# Patient Record
Sex: Male | Born: 1945 | Race: White | Hispanic: No | Marital: Married | Smoking: Never smoker
Health system: Southern US, Community
[De-identification: ages and names within clinical notes are randomized; demographics above are authoritative.]

## PROBLEM LIST (undated history)

## (undated) DIAGNOSIS — M1990 Unspecified osteoarthritis, unspecified site: Secondary | ICD-10-CM

## (undated) DIAGNOSIS — I10 Essential (primary) hypertension: Secondary | ICD-10-CM

## (undated) HISTORY — PX: JOINT REPLACEMENT: SHX530

## (undated) HISTORY — PX: EYE SURGERY: SHX253

## (undated) HISTORY — PX: PROSTATE SURGERY: SHX751

---

## 2009-10-04 ENCOUNTER — Inpatient Hospital Stay (HOSPITAL_COMMUNITY): Admission: RE | Admit: 2009-10-04 | Discharge: 2009-10-07 | Payer: Self-pay | Admitting: Orthopedic Surgery

## 2010-05-05 LAB — URINALYSIS, ROUTINE W REFLEX MICROSCOPIC
Hgb urine dipstick: NEGATIVE
Protein, ur: NEGATIVE mg/dL
Urine Glucose, Fasting: NEGATIVE mg/dL
pH: 6 (ref 5.0–8.0)

## 2010-05-05 LAB — CBC
MCHC: 34.3 g/dL (ref 30.0–36.0)
MCV: 95.3 fL (ref 78.0–100.0)
RDW: 13 % (ref 11.5–15.5)
WBC: 5.9 10*3/uL (ref 4.0–10.5)

## 2010-05-05 LAB — COMPREHENSIVE METABOLIC PANEL
ALT: 22 U/L (ref 0–53)
AST: 26 U/L (ref 0–37)
Alkaline Phosphatase: 55 U/L (ref 39–117)
BUN: 16 mg/dL (ref 6–23)
CO2: 30 mEq/L (ref 19–32)
Calcium: 9.3 mg/dL (ref 8.4–10.5)
Chloride: 105 mEq/L (ref 96–112)
GFR calc non Af Amer: >60 mL/min (ref >60)
Glucose, Bld: 81 mg/dL (ref 70–99)
Sodium: 143 mEq/L (ref 135–145)
Total Bilirubin: 1 mg/dL (ref 0.3–1.2)

## 2010-05-05 LAB — SURGICAL PCR SCREEN: Staphylococcus aureus: NEGATIVE

## 2010-05-05 LAB — PROTIME-INR
INR: 0.93 (ref 0.00–1.49)
Prothrombin Time: 12.7 seconds (ref 11.6–15.2)

## 2010-05-13 ENCOUNTER — Inpatient Hospital Stay (HOSPITAL_COMMUNITY)
Admission: RE | Admit: 2010-05-13 | Discharge: 2010-05-16 | DRG: 209 | Disposition: A | Discharge status: Home / Self Care | Payer: BC Managed Care – PPO | Attending: Orthopedic Surgery | Admitting: Orthopedic Surgery

## 2010-05-13 DIAGNOSIS — M1710 Unilateral primary osteoarthritis, unspecified knee: Principal | ICD-10-CM | POA: Diagnosis present

## 2010-05-13 DIAGNOSIS — N400 Enlarged prostate without lower urinary tract symptoms: Secondary | ICD-10-CM | POA: Diagnosis present

## 2010-05-13 DIAGNOSIS — R609 Edema, unspecified: Secondary | ICD-10-CM | POA: Diagnosis present

## 2010-05-13 DIAGNOSIS — E7800 Pure hypercholesterolemia, unspecified: Secondary | ICD-10-CM | POA: Diagnosis present

## 2010-05-13 DIAGNOSIS — I10 Essential (primary) hypertension: Secondary | ICD-10-CM | POA: Diagnosis present

## 2010-05-13 LAB — TYPE AND SCREEN
ABO/RH(D): A POS
Antibody Screen: NEGATIVE

## 2010-05-14 LAB — BASIC METABOLIC PANEL
BUN: 12 mg/dL (ref 6–23)
GFR calc Af Amer: >60 mL/min (ref >60)
GFR calc non Af Amer: >60 mL/min (ref >60)
Glucose, Bld: 147 mg/dL — ABNORMAL HIGH (ref 70–99)

## 2010-05-14 LAB — CBC
HCT: 35.7 % — ABNORMAL LOW (ref 39.0–52.0)
Hemoglobin: 12 g/dL — ABNORMAL LOW (ref 13.0–17.0)
MCH: 32.1 pg (ref 26.0–34.0)
MCV: 95.5 fL (ref 78.0–100.0)
Platelets: 199 10*3/uL (ref 150–400)
RBC: 3.74 MIL/uL — ABNORMAL LOW (ref 4.22–5.81)
RDW: 12.8 % (ref 11.5–15.5)

## 2010-05-15 LAB — CBC
MCV: 94.9 fL (ref 78.0–100.0)
Platelets: 169 10*3/uL (ref 150–400)
RDW: 12.7 % (ref 11.5–15.5)

## 2010-05-15 LAB — BASIC METABOLIC PANEL
CO2: 31 mEq/L (ref 19–32)
Calcium: 8.6 mg/dL (ref 8.4–10.5)
GFR calc non Af Amer: >60 mL/min (ref >60)

## 2010-05-16 LAB — CBC
MCHC: 34.5 g/dL (ref 30.0–36.0)
Platelets: 176 10*3/uL (ref 150–400)
RDW: 12.7 % (ref 11.5–15.5)

## 2010-05-25 NOTE — Discharge Summary (Signed)
NAME:  Steven Harrington, TINDEL              ACCOUNT NO.:  [id number]  MEDICAL RECORD NO.:  [id number]           PATIENT TYPE:  I  LOCATION:  1604                         FACILITY:  [facility]  PHYSICIAN:  Ollen Gross, M.D.    DATE OF BIRTH:  1945/12/26  DATE OF ADMISSION:  05/13/2010 DATE OF DISCHARGE:  05/16/2010                              DISCHARGE SUMMARY   ADMITTING DIAGNOSES: 1. Osteoarthritis right knee. 2. Hypertension. 3. Mild hypercholesterolemia. 4. Benign prostatic hypertrophy.  DISCHARGE DIAGNOSES: 1. Osteoarthritis right knee status post right total knee replacement     arthroplasty. 2. Hypertension. 3. Mild hypercholesterolemia. 4. Benign prostatic hypertrophy.  PROCEDURE:  On May 13, 2010, right total knee.  SURGEON:  Ollen Gross, M.D.  ASSISTANT:  Patrica Duel, PA-C.  ANESTHESIA:  General.  TOURNIQUET TIME:  43 minutes.  CONSULTS:  None.  BRIEF HISTORY:  Steven Harrington is a 65 year old male seen by Dr. [name] for ongoing progressive end-stage arthritis of the right knee.  He is felt to benefit from undergoing surgical intervention.  Risks and benefits have been discussed and he elected to proceed with surgery.  LABORATORY DATA:  Preop CBC showed hemoglobin of 14.7, hematocrit 42.8, white cell count 5.9, and platelets 201,000.  PT/INR 12.7 and 0.93 with a PTT of 29.  Chem panel on admission all within normal limits.  Preop UA negative.  Blood group type A+.  Nasal swabs were negative for staph aureus and negative for MRSA.  Serial CBCs were followed.  Hemoglobin dropped down to 12 and 10.8 last night.  H and H is 10.1 and 29.3. Serial BMETs were followed for 48 hours.  Electrolytes remained within normal limits.  Glucose did go up from 81 to 147 and back down to 124.  EKG dated September 23, 2009, normal sinus rhythm, nonspecific ST changes confirmed by Dr. Eldridge Dace.  HOSPITAL COURSE:  The patient admitted to [facility], taken to OR,  underwent above-stated procedure without complication.  The patient tolerated the procedure well, later transferred to the recovery room orthopedic floor, started on p.o. and IV analgesic pain control following the surgery.  He had a Hemovac drain placed on the surgery which was pulled without difficulty.  He had a pain pump drain placed which was left in for the first couple of days.  He was allowed to be weightbearing as tolerated.  He was placed on Xarelto for DVT prophylaxis, given 24 hours postop IV antibiotics.  He did actually very well on the morning of day #1.  Hemoglobin looked good.  Starting to get up out of bed with therapy and actually walked extremely well on the first day, and by day #2, he was walking over 400 feet.  He did well with this therapy and was tolerating his p.o. meds.  He was seen on postop day #3 on Monday morning by Dr. [name].  The patient was doing well, progressing with therapy, walking over 400 feet, and tolerating his meds.  He was ready to go home at that point.  DISCHARGE PLAN: 1. The patient was discharged home on May 16, 2010. 2. Discharge diagnoses, please see  above.  DISCHARGE MEDICATIONS: 1. Percocet. 2. Robaxin. 3. Xarelto. 4. Continue home meds of aspirin and ramipril.  FOLLOWUP:  In 2 weeks on Thursday May 26, 2010, call the office for an appointment at [phone].  ACTIVITY:  Weightbearing as tolerated, total knee protocol.  Home health PT, home health nursing.  DISPOSITION:  Home.  CONDITION ON DISCHARGE:  Improved.     Alexzandrew L. Julien Girt, P.A.C.   ______________________________ Ollen Gross, M.D.    ALP/MEDQ  D:  05/16/2010  T:  05/16/2010  Job:  [id number]  cc:   Mattie Marlin, M.D.  Dr. Azucena Kuba  Electronically Signed by Patrica Duel P.A.C. on 05/17/2010 07:44:01 AM Electronically Signed by Ollen Gross M.D. on 05/25/2010 02:52:19 PM

## 2010-05-25 NOTE — Op Note (Signed)
NAME:  Steven Harrington, Steven Harrington NO.:  [id number]  MEDICAL RECORD NO.:  [id number]           PATIENT TYPE:  I  LOCATION:  0003                         FACILITY:  [facility]  PHYSICIAN:  Ollen Gross, M.D.    DATE OF BIRTH:  January 27, 1946  DATE OF PROCEDURE:  05/13/2010 DATE OF DISCHARGE:                              OPERATIVE REPORT   PREOPERATIVE DIAGNOSIS:  Osteoarthritis, right knee.  POSTOPERATIVE DIAGNOSIS:  Osteoarthritis, right knee.  PROCEDURE:  Right total knee arthroplasty.  SURGEON:  Ollen Gross, MD  ASSISTANT:  Alexzandrew L. Perkins, PA-C  ANESTHESIA:  General.  ESTIMATED BLOOD LOSS:  Minimal.  DRAINS:  Hemovac x1.  TOURNIQUET TIME:  43 minutes at 300 mmHg.  COMPLICATIONS:  None.  CONDITION:  Stable to recovery room.  BRIEF CLINICAL NOTE:  Mr. Trejos is a 65 year old male, end-stage arthritis of the right knee with progressively worsening pain and dysfunction.  He has failed nonoperative management and presents for total knee arthroplasty.  PROCEDURE IN DETAIL:  After successful administration of general anesthetic, a tourniquet was placed on the right thigh on right lower extremity, prepped and draped in usual sterile fashion.  Extremities wrapped in Esmarch, knee flexed, tourniquet inflated to 300 mmHg. Midline incision was made with a 10 blade through subcutaneous tissue to the level of the extensor mechanism.  A fresh blade is used to make a medial parapatellar arthrotomy.  Soft tissue on the proximal medial tibia subperiosteally elevated to the joint line with the knife and into the semimembranosus bursa with a Cobb elevator.  Soft tissue laterally is elevated with attention being paid to avoid patellar tendon on tibial tubercle.  Knee was flexed to 90 degrees, ACL and PCL removed.  Drill was used to create a starting hole in the distal femur and the canal was thoroughly irrigated.  The 5 degrees right valgus alignment guide is placed and a  distal femoral cutting block is pinned to remove 11 mm off the distal femur.  Distal femoral resection is made with oscillating saw.  Sizing guide is placed, size 5 is most appropriate.  Rotations marked off the epicondylar axis.  Size 5 cutting block is placed and the anterior-posterior and chamfer cuts made.  The tibia subluxed forward and the menisci removed.  Extramedullary tibial alignment guide is placed referencing proximally at the medial aspect of the tibial tubercle and distally along the second metatarsal axis tibial crest.  The block is pinned to remove 2 mm off the more deficient medial side.  Tibial resection is made with an oscillating saw.  Size 5 is most appropriate tibial component and the proximal tibia is prepared to modular drill and keel punch for the size 5.  Femoral preparation is completed in the intercondylar cut.  Size 5 mobile bearing tibial trial, size 5 posterior stabilized femoral trial, and a 10-mm posterior stabilized rotating platform insert trial are placed.  With the 10, full extensions achieved with excellent varus- valgus and anterior-posterior balance throughout full range of motion. Patella was everted, thickness measured to be 26 mm.  Freehand resection taken to 14 mm, 41 template is placed, lug holes  were drilled, trial patella was placed and it tracks normally.  Osteophytes removed off the posterior femur with the trial in place.  All trials were removed and the cut bone surfaces prepared with pulsatile lavage.  Cement was mixed and once ready for implantation, the size 5 mobile bearing tibial tray, size 5 posterior stabilized femur, and 41 patellar cemented in place. Patella is held the clamp.  Trial 10-mm insert is placed, knee held in full extension.  All extruded cement removed.  When the cement fully hardened, then a permanent 10 mm posterior stabilized rotating platform insert is placed in the tibial tray.  Wounds copiously irrigated  saline solution and the arthrotomy closed over Hemovac drain with interrupted #1 PDS.  Flexion against gravity to 135 degrees.  Patella tracks normally.  Subcu is closed with interrupted 2-0 Vicryl and tourniquet released total time of 43 minutes.  Subcuticular was closed with running 4-0 Monocryl.  The incisions cleaned and dried and then the catheter for Marcaine pain pump placed and the pump initiated.  Steri-Strips and bulky sterile dressing are applied and then he is placed into a knee immobilizer, awakened, and transferred to recovery in stable condition.     Ollen Gross, M.D.     FA/MEDQ  D:  05/13/2010  T:  05/13/2010  Job:  [id number]  Electronically Signed by Ollen Gross M.D. on 05/25/2010 02:52:22 PM

## 2010-05-25 NOTE — H&P (Signed)
NAME:  Steven Harrington, Steven Harrington              ACCOUNT NO.:  [id number]  MEDICAL RECORD NO.:  [id number]           PATIENT TYPE:  I  LOCATION:  1604                         FACILITY:  [facility]  PHYSICIAN:  Ollen Gross, M.D.    DATE OF BIRTH:  1945-07-09  DATE OF ADMISSION:  05/13/2010 DATE OF DISCHARGE:  05/16/2010                             HISTORY & PHYSICAL   CHIEF COMPLAINT:  Right knee pain.  HISTORY OF PRESENT ILLNESS:  The patient is a 65 year old male who has seen by Dr. [name] for ongoing right knee pain.  He has had progressive knee problems for quite some time now.  He has been seen in the office. He has had a previous left total hip and doing very well with his left hip.  The right knee is the issue now.  It is felt he would benefit from undergoing surgical intervention.  Risks and benefits have been discussed.  He would like to proceed with surgery.  ALLERGIES:  No known drug allergies.  CURRENT MEDICATIONS:  Ramipril 10 mg twice a day, glucosamine twice a day, Citracal twice a day, Krill oil twice a day, men's one a day vitamin, aspirin once a day.  PAST MEDICAL HISTORY:  Hypertension, mild hypercholesterolemia, benign prostatic hypertrophy and osteoarthritis.  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  Mother living at age 45 with hypertension.  Diabetes runs in his mother's family.  He has a brother with asthma.  SOCIAL HISTORY:  Married.  Retired Runner, broadcasting/film/video.  Nonsmoker.  Two drinks 3 times a week of alcohol intake.  His wife [name] will be assisting with care after surgery.  He has 2 steps entering his home.  He does have living will.  REVIEW OF SYSTEMS:  GENERAL:  No fevers, chills or night sweats.  NEURO: No seizure, syncope, or paralysis.  RESPIRATORY:  No shortness breath, productive cough or hemoptysis.  CARDIOVASCULAR:  No chest pain, no orthopnea.  GI: No  nausea, vomiting, diarrhea, constipation.  GU: No dysuria, hematuria, discharge.  Musculoskeletal: Right knee.  PHYSICAL EXAMINATION:  VITAL SIGNS:  Pulse 68, respirations 14, blood pressure 142/88. GENERAL:  A 65 year old white male, well nourished, well developed, no acute distress, slightly overweight.  He is alert, oriented, cooperative. HEENT:  Normocephalic, atraumatic.  Pupils round and reactive.  EOMs intact.  He wears  glasses. NECK:  Supple. CHEST:  Clear. HEART:  Regular rate and rhythm without murmur. ABDOMEN:  Soft, nontender.  Bowel sounds present. RECTAL:  Not done, not pertinent to present illness. BREASTS:  Not done, not pertinent to present illness. GENITALIA:  Not done, not pertinent to present illness. EXTREMITIES:  Right knee, no effusion.  Marked crepitus noted on passive range of motion.  Range of motion 10 to 100.  No instability.  IMPRESSION:  Osteoarthritis, right knee.  PLAN:  The patient will be admitted to [facility], undergo a right total knee replacement arthroplasty.  Surgery will be performed by Dr. Ollen Gross.     Alexzandrew L. Julien Girt, P.A.C.  ______________________________ Ollen Gross, M.D.    ALP/MEDQ  D:  05/23/2010  T:  05/23/2010  Job:  [id number]  cc:   Howell Rucks Fax: [phone]  Electronically Signed by Patrica Duel P.A.C. on 05/25/2010 11:22:30 AM Electronically Signed by Ollen Gross M.D. on 05/25/2010 02:52:24 PM

## 2010-06-26 LAB — COMPREHENSIVE METABOLIC PANEL
Albumin: 4.6 g/dL (ref 3.5–5.2)
Alkaline Phosphatase: 55 U/L (ref 39–117)
BUN: 18 mg/dL (ref 6–23)
Calcium: 10 mg/dL (ref 8.4–10.5)
Chloride: 107 mEq/L (ref 96–112)
Creatinine, Ser: 0.98 mg/dL (ref 0.4–1.5)
GFR calc Af Amer: >60 mL/min (ref >60)
GFR calc non Af Amer: >60 mL/min (ref >60)
Glucose, Bld: 91 mg/dL (ref 70–99)
Sodium: 143 mEq/L (ref 135–145)
Total Bilirubin: 1.3 mg/dL — ABNORMAL HIGH (ref 0.3–1.2)
Total Protein: 7.4 g/dL (ref 6.0–8.3)

## 2010-06-26 LAB — URINALYSIS, ROUTINE W REFLEX MICROSCOPIC
Bilirubin Urine: NEGATIVE
Glucose, UA: NEGATIVE mg/dL
Nitrite: NEGATIVE
Protein, ur: NEGATIVE mg/dL
Specific Gravity, Urine: 1.006 (ref 1.005–1.030)

## 2010-06-26 LAB — CBC
HCT: 33.9 % — ABNORMAL LOW (ref 39.0–52.0)
HCT: 36.6 % — ABNORMAL LOW (ref 39.0–52.0)
HCT: 37.3 % — ABNORMAL LOW (ref 39.0–52.0)
HCT: 43.7 % (ref 39.0–52.0)
Hemoglobin: 15.1 g/dL (ref 13.0–17.0)
MCHC: 34.5 g/dL (ref 30.0–36.0)
MCHC: 34.8 g/dL (ref 30.0–36.0)
MCV: 95.7 fL (ref 78.0–100.0)
MCV: 96 fL (ref 78.0–100.0)
Platelets: 169 10*3/uL (ref 150–400)
Platelets: 191 10*3/uL (ref 150–400)
RBC: 3.54 MIL/uL — ABNORMAL LOW (ref 4.22–5.81)
RBC: 3.81 MIL/uL — ABNORMAL LOW (ref 4.22–5.81)
RBC: 4.46 MIL/uL (ref 4.22–5.81)
RDW: 12.2 % (ref 11.5–15.5)
RDW: 12.6 % (ref 11.5–15.5)
RDW: 12.9 % (ref 11.5–15.5)
WBC: 10.2 10*3/uL (ref 4.0–10.5)
WBC: 9.7 10*3/uL (ref 4.0–10.5)

## 2010-06-26 LAB — PROTIME-INR
INR: 1.19 (ref 0.00–1.49)
INR: 1.28 (ref 0.00–1.49)

## 2010-06-26 LAB — SURGICAL PCR SCREEN
MRSA, PCR: NEGATIVE
Staphylococcus aureus: NEGATIVE

## 2010-06-26 LAB — BASIC METABOLIC PANEL
BUN: 6 mg/dL (ref 6–23)
Chloride: 104 mEq/L (ref 96–112)
Chloride: 107 mEq/L (ref 96–112)
GFR calc Af Amer: >60 mL/min (ref >60)
GFR calc non Af Amer: >60 mL/min (ref >60)
Potassium: 4.4 mEq/L (ref 3.5–5.1)
Sodium: 142 mEq/L (ref 135–145)

## 2010-06-26 LAB — TYPE AND SCREEN

## 2010-06-26 LAB — APTT: aPTT: 30 seconds (ref 24–37)

## 2011-10-24 IMAGING — CR DG HIP (WITH OR WITHOUT PELVIS) 2-3V*L*
3 series · 3 of 3 positions shown · non-contrast
Comparison: None.

CLINICAL DATA: Preoperative film for patient with left hip
degenerative disease.

LEFT HIP - COMPLETE 2+ VIEW

[t pelvis a.p.]
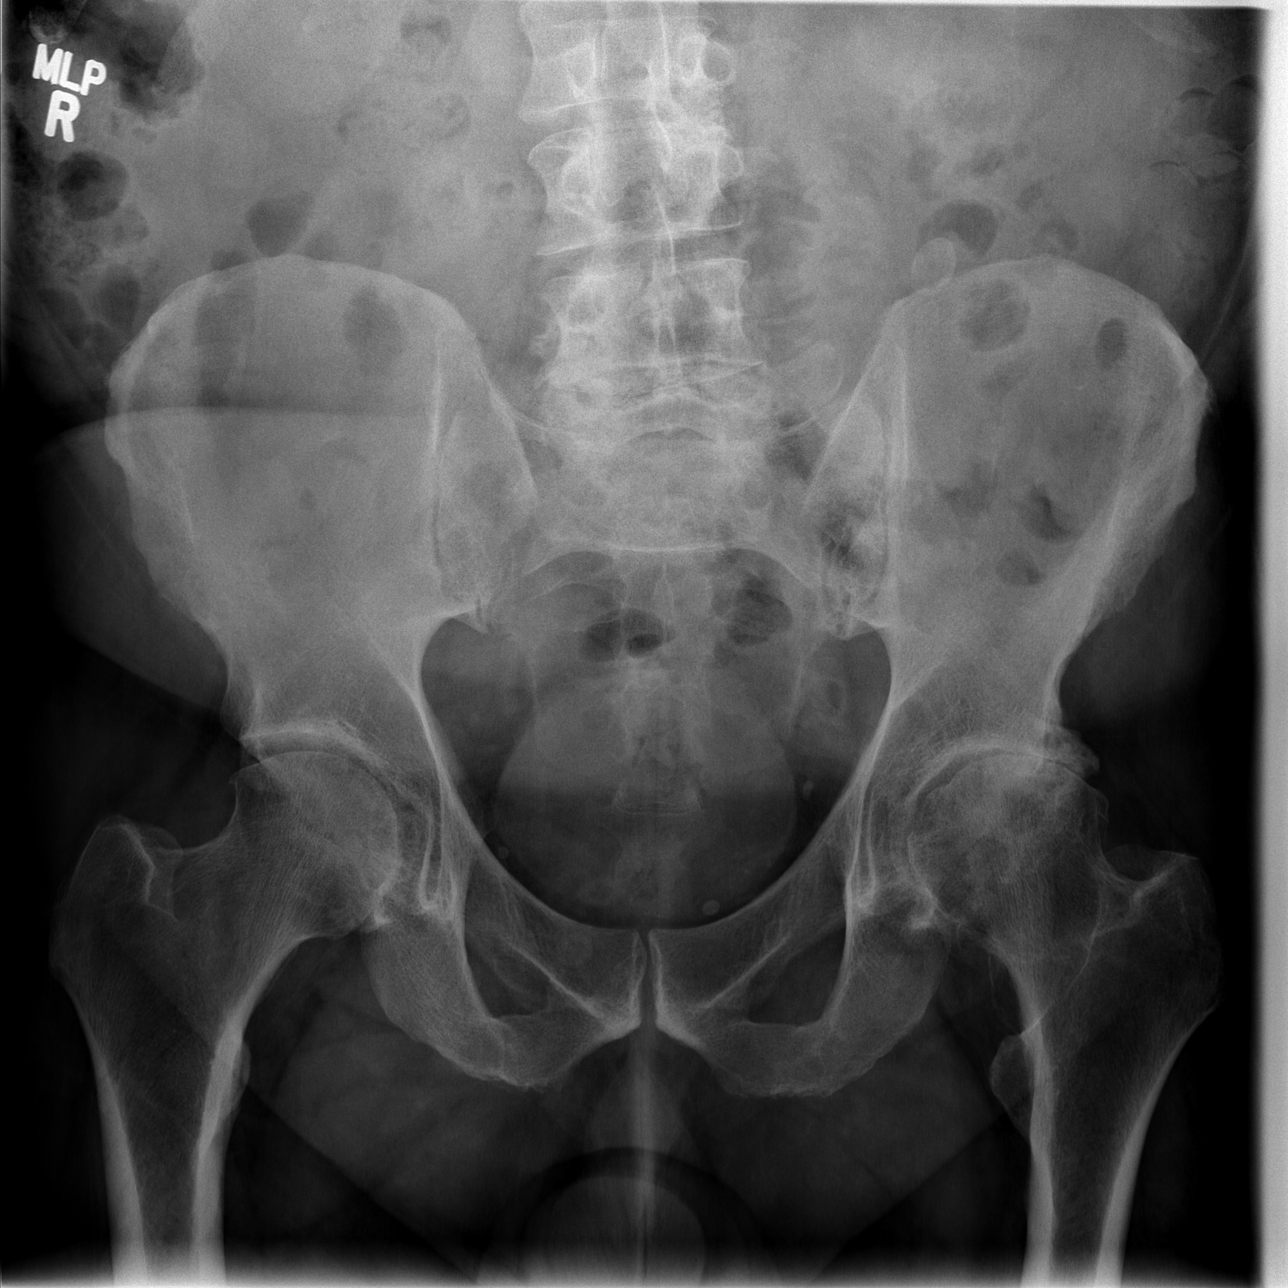

[t hip ap left]
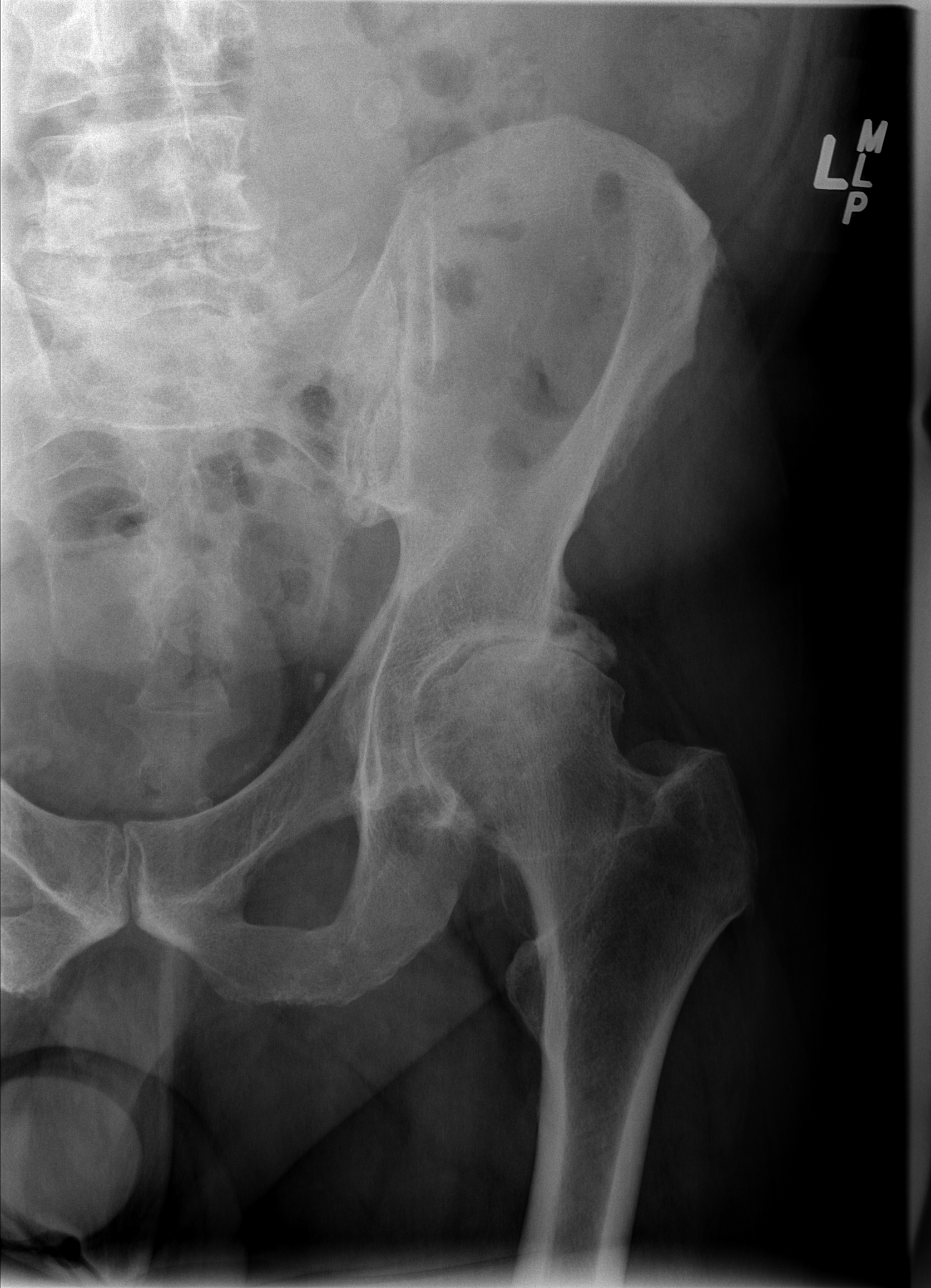

[t hip frog leg left]
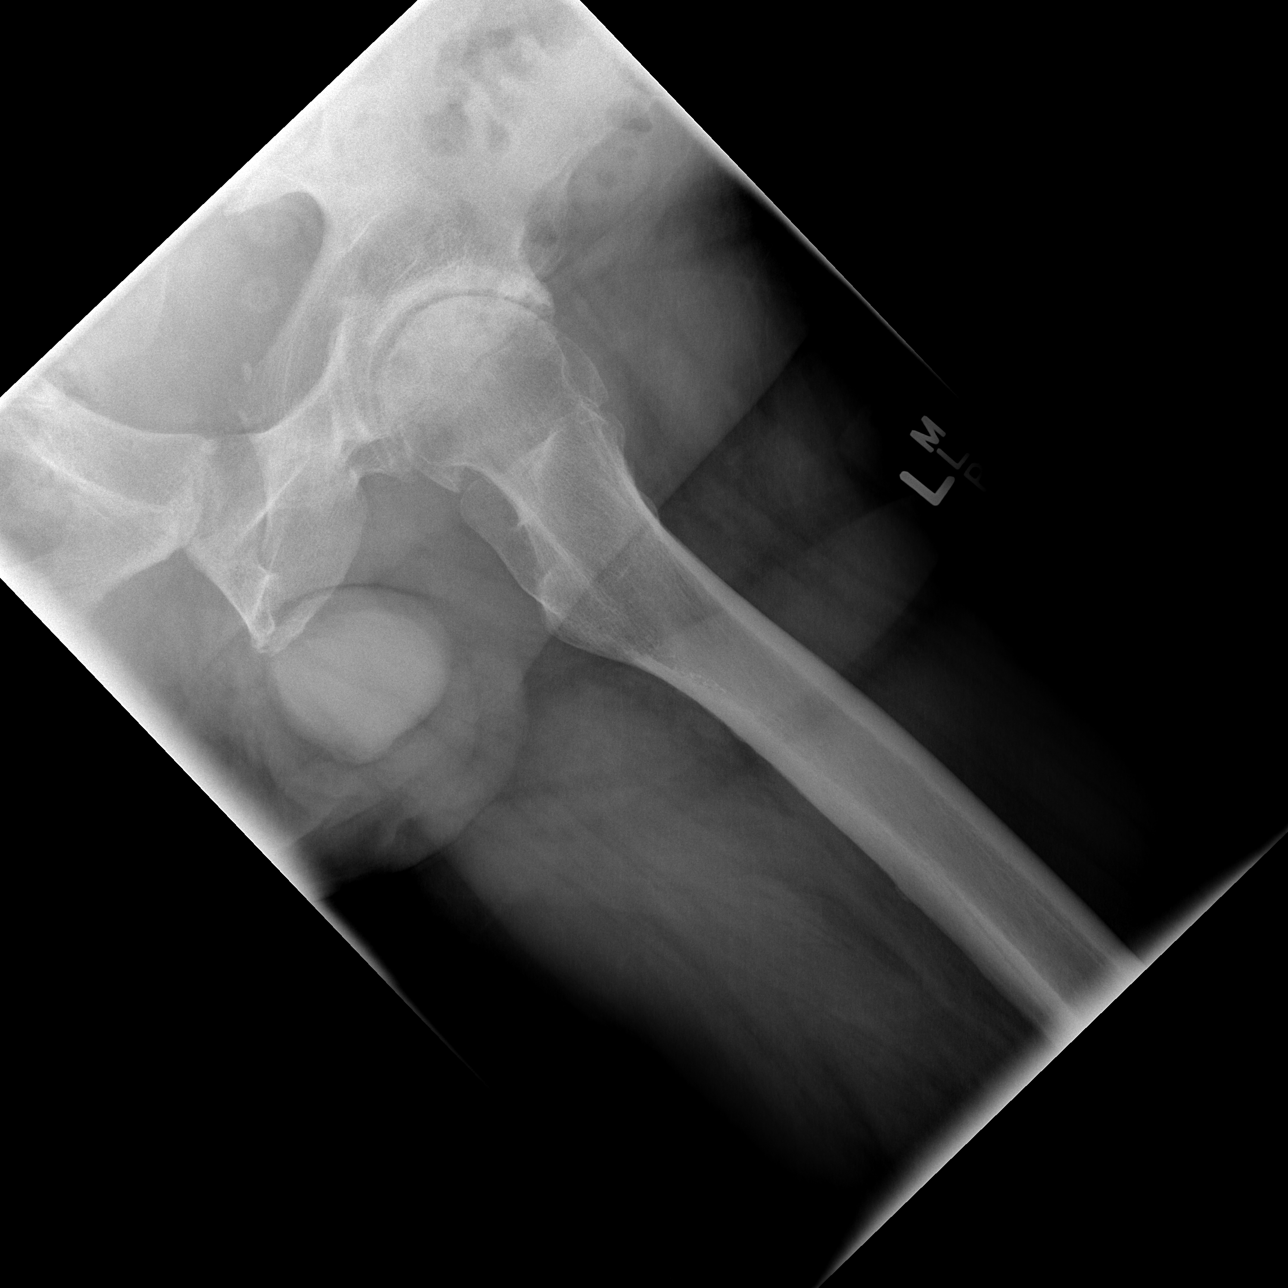

[3 of 3 positions shown; findings below may reference images not displayed]

FINDINGS: The patient has severe left hip degenerative disease with
bone-on-bone joint space narrowing, osteophytosis, subchondral
sclerosis and cyst formation.  Moderate right hip degenerative
disease is noted.  No fracture or dislocation.
IMPRESSION: Severe left and moderate right hip osteoarthritis.

## 2019-04-30 ENCOUNTER — Inpatient Hospital Stay: Admit: 2019-04-30 | Payer: BC Managed Care – PPO | Admitting: Orthopedic Surgery

## 2019-04-30 SURGERY — ARTHROPLASTY, HIP, TOTAL, ANTERIOR APPROACH
Anesthesia: Choice | Site: Hip | Laterality: Right

## 2019-06-23 NOTE — Patient Instructions (Addendum)
DUE TO COVID-19 ONLY ONE VISITOR IS ALLOWED TO COME WITH YOU AND STAY IN THE WAITING ROOM ONLY DURING PRE OP AND PROCEDURE DAY OF SURGERY. THE 1 VISITOR MAY VISIT WITH YOU AFTER SURGERY IN YOUR PRIVATE ROOM DURING VISITING HOURS ONLY!  YOU NEED TO HAVE A COVID 19 TEST ON 06-28-19 @ 12:40 PM, THIS TEST MUST BE DONE BEFORE SURGERY, COME  [address], [place] , [id number].  [facility]) ONCE YOUR COVID TEST IS COMPLETED, PLEASE BEGIN THE QUARANTINE INSTRUCTIONS AS OUTLINED IN YOUR HANDOUT.                Steven Harrington  06/23/2019   Your procedure is scheduled on:  07-02-19    Report to [facility] Main  Entrance    Report to Admitting at 11:45 AM     Call this number if you have problems the morning of surgery [phone]    Remember: NO SOLID FOOD AFTER MIDNIGHT THE NIGHT PRIOR TO SURGERY. NOTHING BY MOUTH EXCEPT CLEAR LIQUIDS UNTIL 11:15 AM  . PLEASE FINISH ENSURE DRINK PER SURGEON ORDER  WHICH NEEDS TO BE COMPLETED AT  11:15 AM.   CLEAR LIQUID DIET   Foods Allowed                                                                     Foods Excluded  Coffee and tea, regular and decaf                             liquids that you cannot  Plain Jell-O any favor except red or purple                                           see through such as: Fruit ices (not with fruit pulp)                                     milk, soups, orange juice  Iced Popsicles                                    All solid food Carbonated beverages, regular and diet                                    Cranberry, grape and apple juices Sports drinks like Gatorade Lightly seasoned clear broth or consume(fat free) Sugar, honey syrup   _____________________________________________________________________     Take these medicines the morning of surgery with A SIP OF WATER: None   BRUSH YOUR TEETH MORNING OF SURGERY AND RINSE YOUR MOUTH OUT, NO CHEWING GUM CANDY OR MINTS.                              You may not have any metal on your body including hair  pins and              piercings     Do not wear jewelry, cologne, lotions, powders or  deodorant                            Men may shave face and neck.   Do not bring valuables to the hospital. [place].  Contacts, dentures or bridgework may not be worn into surgery.  You may bring your overnight bag       Special Instructions: N/A              Please read over the following fact sheets you were given: _____________________________________________________________________             [facility] - Preparing for Surgery Before surgery, you can play an important role.  Because skin is not sterile, your skin needs to be as free of germs as possible.  You can reduce the number of germs on your skin by washing with CHG (chlorahexidine gluconate) soap before surgery.  CHG is an antiseptic cleaner which kills germs and bonds with the skin to continue killing germs even after washing. Please DO NOT use if you have an allergy to CHG or antibacterial soaps.  If your skin becomes reddened/irritated stop using the CHG and inform your nurse when you arrive at Short Stay. Do not shave (including legs and underarms) for at least 48 hours prior to the first CHG shower.  You may shave your face/neck. Please follow these instructions carefully:  1.  Shower with CHG Soap the night before surgery and the  morning of Surgery.  2.  If you choose to wash your hair, wash your hair first as usual with your  normal  shampoo.  3.  After you shampoo, rinse your hair and body thoroughly to remove the  shampoo.                           4.  Use CHG as you would any other liquid soap.  You can apply chg directly  to the skin and wash                       Gently with a scrungie or clean washcloth.  5.  Apply the CHG Soap to your body ONLY FROM THE NECK DOWN.   Do not use on face/ open                            Wound or open sores. Avoid contact with eyes, ears mouth and genitals (private parts).                       Wash face,  Genitals (private parts) with your normal soap.             6.  Wash thoroughly, paying special attention to the area where your surgery  will be performed.  7.  Thoroughly rinse your body with warm water from the neck down.  8.  DO NOT shower/wash with your normal soap after using and rinsing off  the CHG Soap.                9.  Pat yourself dry with a clean towel.            10.  Wear clean pajamas.            11.  Place clean sheets on your bed the night of your first shower and do not  sleep with pets. Day of Surgery : Do not apply any lotions/deodorants the morning of surgery.  Please wear clean clothes to the hospital/surgery center.  FAILURE TO FOLLOW THESE INSTRUCTIONS MAY RESULT IN THE CANCELLATION OF YOUR SURGERY PATIENT SIGNATURE_________________________________  NURSE SIGNATURE__________________________________  ________________________________________________________________________   Steven Harrington  An incentive spirometer is a tool that can help keep your lungs clear and active. This tool measures how well you are filling your lungs with each breath. Taking long deep breaths may help reverse or decrease the chance of developing breathing (pulmonary) problems (especially infection) following:  A long period of time when you are unable to move or be active. BEFORE THE PROCEDURE   If the spirometer includes an indicator to show your best effort, your nurse or respiratory therapist will set it to a desired goal.  If possible, sit up straight or lean slightly forward. Try not to slouch.  Hold the incentive spirometer in an upright position. INSTRUCTIONS FOR USE  1. Sit on the edge of your bed if possible, or sit up as far as you can in bed or on a chair. 2. Hold the incentive spirometer in an upright position. 3. Breathe out  normally. 4. Place the mouthpiece in your mouth and seal your lips tightly around it. 5. Breathe in slowly and as deeply as possible, raising the piston or the ball toward the top of the column. 6. Hold your breath for 3-5 seconds or for as long as possible. Allow the piston or ball to fall to the bottom of the column. 7. Remove the mouthpiece from your mouth and breathe out normally. 8. Rest for a few seconds and repeat Steps 1 through 7 at least 10 times every 1-2 hours when you are awake. Take your time and take a few normal breaths between deep breaths. 9. The spirometer may include an indicator to show your best effort. Use the indicator as a goal to work toward during each repetition. 10. After each set of 10 deep breaths, practice coughing to be sure your lungs are clear. If you have an incision (the cut made at the time of surgery), support your incision when coughing by placing a pillow or rolled up towels firmly against it. Once you are able to get out of bed, walk around indoors and cough well. You may stop using the incentive spirometer when instructed by your caregiver.  RISKS AND COMPLICATIONS  Take your time so you do not get dizzy or light-headed.  If you are in pain, you may need to take or ask for pain medication before doing incentive spirometry. It is harder to take a deep breath if you are having pain. AFTER USE  Rest and breathe slowly and easily.  It can be helpful to keep track of a log of your progress. Your caregiver can provide you with a simple table to help with this. If you are using the spirometer at home, follow these instructions: SEEK MEDICAL CARE IF:   You are having difficultly using the spirometer.  You have trouble using the spirometer as often as instructed.  Your pain medication is not giving enough relief while using the spirometer.  You develop fever of  100.5 F (38.1 C) or higher. SEEK IMMEDIATE MEDICAL CARE IF:   You cough up bloody sputum  that had not been present before.  You develop fever of 102 F (38.9 C) or greater.  You develop worsening pain at or near the incision site. MAKE SURE YOU:   Understand these instructions.  Will watch your condition.  Will get help right away if you are not doing well or get worse. Document Released: 08/07/2006 Document Revised: 06/19/2011 Document Reviewed: 10/08/2006 Exit[employer] Patient Information 2014 Exit[employer], [place].   ________________________________________________________________________  WHAT IS A BLOOD TRANSFUSION? Blood Transfusion Information  A transfusion is the replacement of blood or some of its parts. Blood is made up of multiple cells which provide different functions.  Red blood cells carry oxygen and are used for blood loss replacement.  White blood cells fight against infection.  Platelets control bleeding.  Plasma helps clot blood.  Other blood products are available for specialized needs, such as hemophilia or other clotting disorders. BEFORE THE TRANSFUSION  Who gives blood for transfusions?   Healthy volunteers who are fully evaluated to make sure their blood is safe. This is blood bank blood. Transfusion therapy is the safest it has ever been in the practice of medicine. Before blood is taken from a donor, a complete history is taken to make sure that person has no history of diseases nor engages in risky social behavior (examples are intravenous drug use or sexual activity with multiple partners). The donor's travel history is screened to minimize risk of transmitting infections, such as malaria. The donated blood is tested for signs of infectious diseases, such as HIV and hepatitis. The blood is then tested to be sure it is compatible with you in order to minimize the chance of a transfusion reaction. If you or a relative donates blood, this is often done in anticipation of surgery and is not appropriate for emergency situations. It takes many days to  process the donated blood. RISKS AND COMPLICATIONS Although transfusion therapy is very safe and saves many lives, the main dangers of transfusion include:   Getting an infectious disease.  Developing a transfusion reaction. This is an allergic reaction to something in the blood you were given. Every precaution is taken to prevent this. The decision to have a blood transfusion has been considered carefully by your caregiver before blood is given. Blood is not given unless the benefits outweigh the risks. AFTER THE TRANSFUSION  Right after receiving a blood transfusion, you will usually feel much better and more energetic. This is especially true if your red blood cells have gotten low (anemic). The transfusion raises the level of the red blood cells which carry oxygen, and this usually causes an energy increase.  The nurse administering the transfusion will monitor you carefully for complications. HOME CARE INSTRUCTIONS  No special instructions are needed after a transfusion. You may find your energy is better. Speak with your caregiver about any limitations on activity for underlying diseases you may have. SEEK MEDICAL CARE IF:   Your condition is not improving after your transfusion.  You develop redness or irritation at the intravenous (IV) site. SEEK IMMEDIATE MEDICAL CARE IF:  Any of the following symptoms occur over the next 12 hours:  Shaking chills.  You have a temperature by mouth above 102 F (38.9 C), not controlled by medicine.  Chest, back, or muscle pain.  People around you feel you are not acting correctly or are confused.  Shortness of breath or difficulty  breathing.  Dizziness and fainting.  You get a rash or develop hives.  You have a decrease in urine output.  Your urine turns a dark color or changes to pink, red, or brown. Any of the following symptoms occur over the next 10 days:  You have a temperature by mouth above 102 F (38.9 C), not controlled by  medicine.  Shortness of breath.  Weakness after normal activity.  The white part of the eye turns yellow (jaundice).  You have a decrease in the amount of urine or are urinating less often.  Your urine turns a dark color or changes to pink, red, or brown. Document Released: 03/24/2000 Document Revised: 06/19/2011 Document Reviewed: 11/11/2007 [facility] Patient Information 2014 [place], [place].  _______________________________________________________________________

## 2019-06-23 NOTE — Progress Notes (Incomplete)
PCP - Lonn Georgia,  ?Cardiologist -  ? ?Chest x-ray -  ?EKG -  ?Stress Test -  ?ECHO -  ?Cardiac Cath -  ? ?Sleep Study -  ?CPAP -  ? ?Fasting Blood Sugar -  ?Checks Blood Sugar _____ times a day ? ?Blood Thinner Instructions: ?Aspirin Instructions: 325 mg ASA ?Last Dose: ? ?Anesthesia review:  ? ?Patient denies shortness of breath, fever, cough and chest pain at PAT appointment ? ? ?Patient verbalized understanding of instructions that were given to them at the PAT appointment. Patient was also instructed that they will need to review over the PAT instructions again at home before surgery. ?

## 2019-06-24 ENCOUNTER — Encounter (HOSPITAL_COMMUNITY)
Admission: RE | Admit: 2019-06-24 | Discharge: 2019-06-24 | Disposition: A | Discharge status: Home / Self Care | Payer: Medicare PPO | Source: Ambulatory Visit | Attending: Orthopedic Surgery | Admitting: Orthopedic Surgery

## 2019-06-24 ENCOUNTER — Other Ambulatory Visit: Payer: Self-pay

## 2019-06-24 ENCOUNTER — Encounter (HOSPITAL_COMMUNITY): Payer: Self-pay

## 2019-06-24 DIAGNOSIS — Z01818 Encounter for other preprocedural examination: Secondary | ICD-10-CM | POA: Insufficient documentation

## 2019-06-24 DIAGNOSIS — Z20822 Contact with and (suspected) exposure to covid-19: Secondary | ICD-10-CM | POA: Insufficient documentation

## 2019-06-24 HISTORY — DX: Essential (primary) hypertension: I10

## 2019-06-24 HISTORY — DX: Unspecified osteoarthritis, unspecified site: M19.90

## 2019-06-25 ENCOUNTER — Encounter (HOSPITAL_COMMUNITY)
Admission: RE | Admit: 2019-06-25 | Discharge: 2019-06-25 | Disposition: A | Discharge status: Home / Self Care | Payer: Medicare PPO | Source: Ambulatory Visit | Attending: Orthopedic Surgery | Admitting: Orthopedic Surgery

## 2019-06-25 DIAGNOSIS — Z20822 Contact with and (suspected) exposure to covid-19: Secondary | ICD-10-CM | POA: Diagnosis not present

## 2019-06-25 DIAGNOSIS — Z01818 Encounter for other preprocedural examination: Secondary | ICD-10-CM | POA: Diagnosis not present

## 2019-06-25 LAB — CBC
HCT: 47.2 % (ref 39.0–52.0)
Hemoglobin: 15.4 g/dL (ref 13.0–17.0)
MCH: 32.8 pg (ref 26.0–34.0)
MCHC: 32.6 g/dL (ref 30.0–36.0)
MCV: 100.6 fL — ABNORMAL HIGH (ref 80.0–100.0)
Platelets: 213 10*3/uL (ref 150–400)
RBC: 4.69 MIL/uL (ref 4.22–5.81)
RDW: 12.4 % (ref 11.5–15.5)
WBC: 5.3 10*3/uL (ref 4.0–10.5)
nRBC: 0 % (ref 0.0–0.2)

## 2019-06-25 LAB — COMPREHENSIVE METABOLIC PANEL
ALT: 24 U/L (ref 0–44)
AST: 24 U/L (ref 15–41)
Albumin: 4.1 g/dL (ref 3.5–5.0)
Alkaline Phosphatase: 88 U/L (ref 38–126)
Anion gap: 7 (ref 5–15)
BUN: 15 mg/dL (ref 8–23)
CO2: 28 mmol/L (ref 22–32)
Calcium: 9.6 mg/dL (ref 8.9–10.3)
Chloride: 108 mmol/L (ref 98–111)
Creatinine, Ser: 0.82 mg/dL (ref 0.61–1.24)
GFR calc Af Amer: >60 mL/min (ref >60)
GFR calc non Af Amer: >60 mL/min (ref >60)
Glucose, Bld: 109 mg/dL — ABNORMAL HIGH (ref 70–99)
Potassium: 4.3 mmol/L (ref 3.5–5.1)
Sodium: 143 mmol/L (ref 135–145)
Total Bilirubin: 1.6 mg/dL — ABNORMAL HIGH (ref 0.3–1.2)
Total Protein: 7.2 g/dL (ref 6.5–8.1)

## 2019-06-25 LAB — SURGICAL PCR SCREEN
MRSA, PCR: NEGATIVE
Staphylococcus aureus: POSITIVE — AB

## 2019-06-25 LAB — APTT: aPTT: 28 seconds (ref 24–36)

## 2019-06-25 LAB — PROTIME-INR
INR: 1 (ref 0.8–1.2)
Prothrombin Time: 13.4 seconds (ref 11.4–15.2)

## 2019-06-26 LAB — ABO/RH: ABO/RH(D): A POS

## 2019-06-28 ENCOUNTER — Other Ambulatory Visit (HOSPITAL_COMMUNITY)
Admission: RE | Admit: 2019-06-28 | Discharge: 2019-06-28 | Disposition: A | Discharge status: Home / Self Care | Payer: Medicare PPO | Source: Ambulatory Visit | Attending: Orthopedic Surgery | Admitting: Orthopedic Surgery

## 2019-06-28 DIAGNOSIS — Z20822 Contact with and (suspected) exposure to covid-19: Secondary | ICD-10-CM | POA: Insufficient documentation

## 2019-06-28 DIAGNOSIS — Z01812 Encounter for preprocedural laboratory examination: Secondary | ICD-10-CM | POA: Diagnosis present

## 2019-06-28 LAB — SARS CORONAVIRUS 2 (TAT 6-24 HRS): SARS Coronavirus 2: NEGATIVE

## 2019-07-01 NOTE — H&P (Signed)
TOTAL HIP ADMISSION H&P  Patient is admitted for right total hip arthroplasty.  Subjective:  Chief Complaint: right hip pain  HPI: Steven Harrington, 74 y.o. male, has a history of pain and functional disability in the right hip(s) due to arthritis and patient has failed non-surgical conservative treatments for greater than 12 weeks to include NSAID's and/or analgesics and activity modification.  Onset of symptoms was gradual starting 2 years ago with gradually worsening course since that time.The patient noted no past surgery on the right hip(s).  Patient currently rates pain in the right hip at 8 out of 10 with activity. Patient has worsening of pain with activity and weight bearing and pain that interfers with activities of daily living. Patient has evidence of joint space narrowing by imaging studies. This condition presents safety issues increasing the risk of falls.  There is no current active infection.  There are no problems to display for this patient.  Past Medical History:  Diagnosis Date  . Arthritis   . Hypertension     Past Surgical History:  Procedure Laterality Date  . EYE SURGERY    . JOINT REPLACEMENT Left    L Hip, and R Knee  . PROSTATE SURGERY     Laser    No current facility-administered medications for this encounter.   Current Outpatient Medications  Medication Sig Dispense Refill Last Dose  . aspirin EC 325 MG tablet Take 325 mg by mouth at bedtime.      . Calcium Citrate-Vitamin D (CITRACAL + D PO) Take 1 tablet by mouth daily.      . Multiple Vitamin (MULTIVITAMIN WITH MINERALS) TABS tablet Take 1 tablet by mouth daily. Men's Multivitamin 50+     . ramipril (ALTACE) 10 MG capsule Take 10 mg by mouth in the morning and at bedtime.      No Known Allergies  Social History   Tobacco Use  . Smoking status: Never Smoker  . Smokeless tobacco: Never Used  Substance Use Topics  . Alcohol use: Yes    Alcohol/week: 3.0 standard drinks    Types: 1 Glasses of  wine, 1 Cans of beer, 1 Shots of liquor per week    Comment: occas    No family history on file.   Review of Systems  Constitutional: Negative for chills and fever.  Respiratory: Negative for cough and shortness of breath.   Gastrointestinal: Negative for nausea and vomiting.  Musculoskeletal: Positive for arthralgias.    Objective:  Physical Exam Patient is a 74 year old male.  Well nourished and well developed. General: Alert and oriented x3, cooperative and pleasant, no acute distress. Head: normocephalic, atraumatic, neck supple. Eyes: EOMI. Respiratory: breath sounds clear in all fields, no wheezing, rales, or rhonchi. Cardiovascular: Regular rate and rhythm, no murmurs, gallops or rubs. Abdomen: non-tender to palpation and soft, normoactive bowel sounds.  Musculoskeletal: Right Hip Exam: ROM: Flexion to 100, Internal Rotation 0, External Rotation 10, and abduction 10 without discomfort. There is no tenderness over the greater trochanter bursa.  Calves soft and nontender. Motor function intact in LE. Strength 5/5 LE bilaterally. Neuro: Distal pulses 2+. Sensation to light touch intact in LE. Vital signs in last 24 hours: Labs:  Estimated body mass index is 33.56 kg/m as calculated from the following:   Height as of 06/25/19: 6' (1.829 m).   Weight as of 06/25/19: 112.2 kg.   Imaging Review Plain radiographs demonstrate severe degenerative joint disease of the right hip(s). The bone quality appears to  be adequate for age and reported activity level. Assessment/Plan:  End stage arthritis, right hip(s)  The patient history, physical examination, clinical judgement of the provider and imaging studies are consistent with end stage degenerative joint disease of the right hip(s) and total hip arthroplasty is deemed medically necessary. The treatment options including medical management, injection therapy, arthroscopy and arthroplasty were discussed at length. The risks and  benefits of total hip arthroplasty were presented and reviewed. The risks due to aseptic loosening, infection, stiffness, dislocation/subluxation,  thromboembolic complications and other imponderables were discussed.  The patient acknowledged the explanation, agreed to proceed with the plan and consent was signed. Patient is being admitted for inpatient treatment for surgery, pain control, PT, OT, prophylactic antibiotics, VTE prophylaxis, progressive ambulation and ADL's and discharge planning.The patient is planning to be discharged home.   Therapy Plans: HEP Disposition: Home with wife Planned DVT Prophylaxis: aspirin 325mg  BID DME needed: none PCP: Dr. , clearance received TXA: IV Allergies: NKDA Anesthesia Concerns: none BMI: 34.2 Not diabetic.  - Patient was instructed on what medications to stop prior to surgery. - Follow-up visit in 2 weeks with Dr. Mattie Marlin - [facility] following surgery - Pre-operative lab work as pre-surgical testing - Prescriptions will be provided in hospital at time of discharge  Steven Halt, PA-C Orthopedic Surgery [facility] [place] [phone]

## 2019-07-02 ENCOUNTER — Telehealth (HOSPITAL_COMMUNITY): Payer: Self-pay | Admitting: *Deleted

## 2019-07-02 ENCOUNTER — Ambulatory Visit (HOSPITAL_COMMUNITY): Payer: Medicare PPO

## 2019-07-02 ENCOUNTER — Ambulatory Visit (HOSPITAL_COMMUNITY)
Admission: RE | Admit: 2019-07-02 | Discharge: 2019-07-03 | Disposition: A | Discharge status: Home / Self Care | Payer: Medicare PPO | Source: Ambulatory Visit | Attending: Orthopedic Surgery | Admitting: Orthopedic Surgery

## 2019-07-02 ENCOUNTER — Encounter (HOSPITAL_COMMUNITY): Payer: Self-pay | Admitting: Orthopedic Surgery

## 2019-07-02 ENCOUNTER — Ambulatory Visit (HOSPITAL_COMMUNITY): Payer: Medicare PPO | Admitting: Certified Registered Nurse Anesthetist

## 2019-07-02 ENCOUNTER — Encounter (HOSPITAL_COMMUNITY)
Admission: RE | Disposition: A | Discharge status: Home / Self Care | Payer: Self-pay | Source: Ambulatory Visit | Attending: Orthopedic Surgery

## 2019-07-02 ENCOUNTER — Other Ambulatory Visit: Payer: Self-pay

## 2019-07-02 ENCOUNTER — Ambulatory Visit (HOSPITAL_COMMUNITY): Payer: Medicare PPO | Admitting: Physician Assistant

## 2019-07-02 DIAGNOSIS — Z6834 Body mass index (BMI) 34.0-34.9, adult: Secondary | ICD-10-CM | POA: Diagnosis not present

## 2019-07-02 DIAGNOSIS — M1611 Unilateral primary osteoarthritis, right hip: Secondary | ICD-10-CM | POA: Insufficient documentation

## 2019-07-02 DIAGNOSIS — Z96642 Presence of left artificial hip joint: Secondary | ICD-10-CM | POA: Diagnosis not present

## 2019-07-02 DIAGNOSIS — Z7982 Long term (current) use of aspirin: Secondary | ICD-10-CM | POA: Diagnosis not present

## 2019-07-02 DIAGNOSIS — I10 Essential (primary) hypertension: Secondary | ICD-10-CM | POA: Insufficient documentation

## 2019-07-02 DIAGNOSIS — Z96651 Presence of right artificial knee joint: Secondary | ICD-10-CM | POA: Insufficient documentation

## 2019-07-02 DIAGNOSIS — E669 Obesity, unspecified: Secondary | ICD-10-CM | POA: Diagnosis not present

## 2019-07-02 DIAGNOSIS — Z96649 Presence of unspecified artificial hip joint: Secondary | ICD-10-CM

## 2019-07-02 DIAGNOSIS — M169 Osteoarthritis of hip, unspecified: Secondary | ICD-10-CM | POA: Diagnosis present

## 2019-07-02 DIAGNOSIS — Z419 Encounter for procedure for purposes other than remedying health state, unspecified: Secondary | ICD-10-CM

## 2019-07-02 HISTORY — PX: TOTAL HIP ARTHROPLASTY: SHX124

## 2019-07-02 LAB — TYPE AND SCREEN
ABO/RH(D): A POS
Antibody Screen: NEGATIVE

## 2019-07-02 SURGERY — ARTHROPLASTY, HIP, TOTAL, ANTERIOR APPROACH
Anesthesia: Spinal | Site: Hip | Laterality: Right

## 2019-07-02 MED ORDER — FENTANYL CITRATE (PF) 100 MCG/2ML IJ SOLN
INTRAMUSCULAR | Status: AC
  Filled 2019-07-02: qty 2

## 2019-07-02 MED ORDER — FENTANYL CITRATE (PF) 100 MCG/2ML IJ SOLN
INTRAMUSCULAR | Status: DC | PRN
  Administered 2019-07-02 (×6): 50 ug via INTRAVENOUS

## 2019-07-02 MED ORDER — LIDOCAINE 2% (20 MG/ML) 5 ML SYRINGE
INTRAMUSCULAR | Status: DC | PRN
  Administered 2019-07-02: 80 mg via INTRAVENOUS

## 2019-07-02 MED ORDER — METHOCARBAMOL 500 MG IVPB - SIMPLE MED
INTRAVENOUS | Status: AC
  Filled 2019-07-02: qty 50

## 2019-07-02 MED ORDER — POVIDONE-IODINE 10 % EX SWAB
2.0000 "application " | Freq: Once | CUTANEOUS | Status: AC
  Administered 2019-07-02: 2 via TOPICAL

## 2019-07-02 MED ORDER — HYDROCODONE-ACETAMINOPHEN 7.5-325 MG PO TABS
1.0000 | ORAL_TABLET | ORAL | Status: DC | PRN
  Administered 2019-07-02 – 2019-07-03 (×3): 1 via ORAL
  Filled 2019-07-02 (×3): qty 1

## 2019-07-02 MED ORDER — ACETAMINOPHEN 325 MG PO TABS: 325.0000 mg | ORAL_TABLET | Freq: Four times a day (QID) | ORAL | Status: DC | PRN

## 2019-07-02 MED ORDER — BUPIVACAINE HCL 0.25 % IJ SOLN
INTRAMUSCULAR | Status: DC | PRN
  Administered 2019-07-02: 30 mL via INTRA_ARTICULAR

## 2019-07-02 MED ORDER — MENTHOL 3 MG MT LOZG: 1.0000 | LOZENGE | OROMUCOSAL | Status: DC | PRN

## 2019-07-02 MED ORDER — MORPHINE SULFATE (PF) 2 MG/ML IV SOLN
0.5000 mg | INTRAVENOUS | Status: DC | PRN
  Filled 2019-07-02: qty 1

## 2019-07-02 MED ORDER — PHENYLEPHRINE 40 MCG/ML (10ML) SYRINGE FOR IV PUSH (FOR BLOOD PRESSURE SUPPORT)
PREFILLED_SYRINGE | INTRAVENOUS | Status: AC
  Filled 2019-07-02: qty 10

## 2019-07-02 MED ORDER — POLYETHYLENE GLYCOL 3350 17 G PO PACK: 17.0000 g | PACK | Freq: Every day | ORAL | Status: DC | PRN

## 2019-07-02 MED ORDER — SUCCINYLCHOLINE CHLORIDE 200 MG/10ML IV SOSY
PREFILLED_SYRINGE | INTRAVENOUS | Status: DC | PRN
  Administered 2019-07-02: 120 mg via INTRAVENOUS

## 2019-07-02 MED ORDER — ONDANSETRON HCL 4 MG PO TABS: 4.0000 mg | ORAL_TABLET | Freq: Four times a day (QID) | ORAL | Status: DC | PRN

## 2019-07-02 MED ORDER — ACETAMINOPHEN 10 MG/ML IV SOLN
1000.0000 mg | Freq: Four times a day (QID) | INTRAVENOUS | Status: DC
  Administered 2019-07-02: 1000 mg via INTRAVENOUS
  Filled 2019-07-02: qty 100

## 2019-07-02 MED ORDER — LACTATED RINGERS IV SOLN: INTRAVENOUS | Status: DC

## 2019-07-02 MED ORDER — ONDANSETRON HCL 4 MG/2ML IJ SOLN: 4.0000 mg | Freq: Four times a day (QID) | INTRAMUSCULAR | Status: DC | PRN

## 2019-07-02 MED ORDER — TRAMADOL HCL 50 MG PO TABS: 50.0000 mg | ORAL_TABLET | Freq: Four times a day (QID) | ORAL | Status: DC | PRN

## 2019-07-02 MED ORDER — DEXAMETHASONE SODIUM PHOSPHATE 10 MG/ML IJ SOLN
8.0000 mg | Freq: Once | INTRAMUSCULAR | Status: AC
  Administered 2019-07-02: 8 mg via INTRAVENOUS

## 2019-07-02 MED ORDER — SODIUM CHLORIDE 0.9 % IV SOLN: INTRAVENOUS | Status: DC

## 2019-07-02 MED ORDER — CEFAZOLIN SODIUM-DEXTROSE 2-4 GM/100ML-% IV SOLN
2.0000 g | Freq: Four times a day (QID) | INTRAVENOUS | Status: AC
  Administered 2019-07-02 – 2019-07-03 (×2): 2 g via INTRAVENOUS
  Filled 2019-07-02 (×2): qty 100

## 2019-07-02 MED ORDER — BISACODYL 10 MG RE SUPP: 10.0000 mg | Freq: Every day | RECTAL | Status: DC | PRN

## 2019-07-02 MED ORDER — FENTANYL CITRATE (PF) 100 MCG/2ML IJ SOLN
25.0000 ug | INTRAMUSCULAR | Status: DC | PRN
  Administered 2019-07-02 (×2): 50 ug via INTRAVENOUS

## 2019-07-02 MED ORDER — PROPOFOL 1000 MG/100ML IV EMUL
INTRAVENOUS | Status: AC
  Filled 2019-07-02: qty 100

## 2019-07-02 MED ORDER — CEFAZOLIN SODIUM-DEXTROSE 2-4 GM/100ML-% IV SOLN
2.0000 g | INTRAVENOUS | Status: AC
  Administered 2019-07-02: 13:00:00 2 g via INTRAVENOUS
  Filled 2019-07-02: qty 100

## 2019-07-02 MED ORDER — FENTANYL CITRATE (PF) 100 MCG/2ML IJ SOLN
INTRAMUSCULAR | Status: AC
  Filled 2019-07-02: qty 4

## 2019-07-02 MED ORDER — PROPOFOL 10 MG/ML IV BOLUS
INTRAVENOUS | Status: DC | PRN
  Administered 2019-07-02: 150 mg via INTRAVENOUS

## 2019-07-02 MED ORDER — DEXAMETHASONE SODIUM PHOSPHATE 10 MG/ML IJ SOLN
INTRAMUSCULAR | Status: AC
  Filled 2019-07-02: qty 1

## 2019-07-02 MED ORDER — 0.9 % SODIUM CHLORIDE (POUR BTL) OPTIME
TOPICAL | Status: DC | PRN
  Administered 2019-07-02: 14:00:00 1000 mL

## 2019-07-02 MED ORDER — DEXAMETHASONE SODIUM PHOSPHATE 10 MG/ML IJ SOLN
10.0000 mg | Freq: Once | INTRAMUSCULAR | Status: AC
  Administered 2019-07-03: 10 mg via INTRAVENOUS
  Filled 2019-07-02: qty 1

## 2019-07-02 MED ORDER — PHENYLEPHRINE 40 MCG/ML (10ML) SYRINGE FOR IV PUSH (FOR BLOOD PRESSURE SUPPORT)
PREFILLED_SYRINGE | INTRAVENOUS | Status: DC | PRN
  Administered 2019-07-02: 120 ug via INTRAVENOUS

## 2019-07-02 MED ORDER — PHENYLEPHRINE HCL (PRESSORS) 10 MG/ML IV SOLN
INTRAVENOUS | Status: AC
  Filled 2019-07-02: qty 1

## 2019-07-02 MED ORDER — METOCLOPRAMIDE HCL 5 MG/ML IJ SOLN: 5.0000 mg | Freq: Three times a day (TID) | INTRAMUSCULAR | Status: DC | PRN

## 2019-07-02 MED ORDER — CHLORHEXIDINE GLUCONATE 4 % EX LIQD: 60.0000 mL | Freq: Once | CUTANEOUS | Status: DC

## 2019-07-02 MED ORDER — METHOCARBAMOL 500 MG IVPB - SIMPLE MED
500.0000 mg | Freq: Four times a day (QID) | INTRAVENOUS | Status: DC | PRN
  Administered 2019-07-02: 500 mg via INTRAVENOUS
  Filled 2019-07-02: qty 50

## 2019-07-02 MED ORDER — BUPIVACAINE HCL (PF) 0.25 % IJ SOLN
INTRAMUSCULAR | Status: AC
  Filled 2019-07-02: qty 30

## 2019-07-02 MED ORDER — DOCUSATE SODIUM 100 MG PO CAPS
100.0000 mg | ORAL_CAPSULE | Freq: Two times a day (BID) | ORAL | Status: DC
  Administered 2019-07-02 – 2019-07-03 (×2): 100 mg via ORAL
  Filled 2019-07-02 (×2): qty 1

## 2019-07-02 MED ORDER — METHOCARBAMOL 500 MG PO TABS
500.0000 mg | ORAL_TABLET | Freq: Four times a day (QID) | ORAL | Status: DC | PRN
  Administered 2019-07-02: 500 mg via ORAL
  Filled 2019-07-02: qty 1

## 2019-07-02 MED ORDER — PHENOL 1.4 % MT LIQD: 1.0000 | OROMUCOSAL | Status: DC | PRN

## 2019-07-02 MED ORDER — ASPIRIN EC 325 MG PO TBEC
325.0000 mg | DELAYED_RELEASE_TABLET | Freq: Two times a day (BID) | ORAL | Status: DC
  Administered 2019-07-03: 325 mg via ORAL
  Filled 2019-07-02: qty 1

## 2019-07-02 MED ORDER — ROCURONIUM BROMIDE 50 MG/5ML IV SOSY
PREFILLED_SYRINGE | INTRAVENOUS | Status: DC | PRN
  Administered 2019-07-02: 40 mg via INTRAVENOUS

## 2019-07-02 MED ORDER — ONDANSETRON HCL 4 MG/2ML IJ SOLN
INTRAMUSCULAR | Status: AC
  Filled 2019-07-02: qty 2

## 2019-07-02 MED ORDER — ONDANSETRON HCL 4 MG/2ML IJ SOLN
INTRAMUSCULAR | Status: DC | PRN
  Administered 2019-07-02: 4 mg via INTRAVENOUS

## 2019-07-02 MED ORDER — WATER FOR IRRIGATION, STERILE IR SOLN
Status: DC | PRN
  Administered 2019-07-02: 2000 mL

## 2019-07-02 MED ORDER — METOCLOPRAMIDE HCL 5 MG PO TABS: 5.0000 mg | ORAL_TABLET | Freq: Three times a day (TID) | ORAL | Status: DC | PRN

## 2019-07-02 MED ORDER — MAGNESIUM CITRATE PO SOLN: 1.0000 | Freq: Once | ORAL | Status: DC | PRN

## 2019-07-02 MED ORDER — TRANEXAMIC ACID-NACL 1000-0.7 MG/100ML-% IV SOLN
1000.0000 mg | INTRAVENOUS | Status: AC
  Administered 2019-07-02: 14:00:00 1000 mg via INTRAVENOUS
  Filled 2019-07-02: qty 100

## 2019-07-02 MED ORDER — SUGAMMADEX SODIUM 200 MG/2ML IV SOLN
INTRAVENOUS | Status: DC | PRN
  Administered 2019-07-02: 200 mg via INTRAVENOUS

## 2019-07-02 MED ORDER — HYDROCODONE-ACETAMINOPHEN 5-325 MG PO TABS
1.0000 | ORAL_TABLET | ORAL | Status: DC | PRN
  Administered 2019-07-02: 1 via ORAL
  Administered 2019-07-03: 2 via ORAL
  Filled 2019-07-02: qty 2
  Filled 2019-07-02: qty 1

## 2019-07-02 SURGICAL SUPPLY — 46 items
BAG DECANTER FOR FLEXI CONT (MISCELLANEOUS) IMPLANT
BAG ZIPLOCK 12X15 (MISCELLANEOUS) ×2 IMPLANT
BLADE SAG 18X100X1.27 (BLADE) ×3 IMPLANT
CLOSURE WOUND 1/2 X4 (GAUZE/BANDAGES/DRESSINGS) ×2
COVER PERINEAL POST (MISCELLANEOUS) ×3 IMPLANT
COVER SURGICAL LIGHT HANDLE (MISCELLANEOUS) ×3 IMPLANT
COVER WAND RF STERILE (DRAPES) IMPLANT
CUP ACET PINNACLE SECTR 56MM (Hips) IMPLANT
DECANTER SPIKE VIAL GLASS SM (MISCELLANEOUS) ×3 IMPLANT
DRAPE STERI IOBAN 125X83 (DRAPES) ×3 IMPLANT
DRAPE U-SHAPE 47X51 STRL (DRAPES) ×6 IMPLANT
DRSG ADAPTIC 3X8 NADH LF (GAUZE/BANDAGES/DRESSINGS) ×3 IMPLANT
DRSG AQUACEL AG ADV 3.5X10 (GAUZE/BANDAGES/DRESSINGS) ×3 IMPLANT
DURAPREP 26ML APPLICATOR (WOUND CARE) ×3 IMPLANT
ELECT REM PT RETURN 15FT ADLT (MISCELLANEOUS) ×3 IMPLANT
EVACUATOR 1/8 PVC DRAIN (DRAIN) ×2 IMPLANT
GLOVE BIO SURGEON STRL SZ 6 (GLOVE) IMPLANT
GLOVE BIO SURGEON STRL SZ7 (GLOVE) ×2 IMPLANT
GLOVE BIO SURGEON STRL SZ8 (GLOVE) ×3 IMPLANT
GLOVE BIOGEL PI IND STRL 6.5 (GLOVE) IMPLANT
GLOVE BIOGEL PI IND STRL 7.0 (GLOVE) IMPLANT
GLOVE BIOGEL PI IND STRL 8 (GLOVE) ×1 IMPLANT
GLOVE BIOGEL PI INDICATOR 6.5 (GLOVE)
GLOVE BIOGEL PI INDICATOR 7.0 (GLOVE) ×2
GLOVE BIOGEL PI INDICATOR 8 (GLOVE) ×2
GOWN STRL REUS W/TWL LRG LVL3 (GOWN DISPOSABLE) ×3 IMPLANT
GOWN STRL REUS W/TWL XL LVL3 (GOWN DISPOSABLE) IMPLANT
HEAD CERAMIC DELTA 36 PLUS 1.5 (Hips) ×2 IMPLANT
HOLDER FOLEY CATH W/STRAP (MISCELLANEOUS) ×3 IMPLANT
KIT TURNOVER KIT A (KITS) IMPLANT
LINER MARATHON 4 NEUTRAL 36X56 (Hips) ×2 IMPLANT
MANIFOLD NEPTUNE II (INSTRUMENTS) ×3 IMPLANT
PACK ANTERIOR HIP CUSTOM (KITS) ×3 IMPLANT
PENCIL SMOKE EVACUATOR COATED (MISCELLANEOUS) ×3 IMPLANT
PINNACLE SECTOR CUP 56MM (Hips) ×3 IMPLANT
STEM FEMORAL SZ6 HIGH ACTIS (Stem) ×2 IMPLANT
STRIP CLOSURE SKIN 1/2X4 (GAUZE/BANDAGES/DRESSINGS) ×3 IMPLANT
SUT ETHIBOND NAB CT1 #1 30IN (SUTURE) ×3 IMPLANT
SUT MNCRL AB 4-0 PS2 18 (SUTURE) ×3 IMPLANT
SUT STRATAFIX 0 PDS 27 VIOLET (SUTURE) ×3
SUT VIC AB 2-0 CT1 27 (SUTURE) ×4
SUT VIC AB 2-0 CT1 TAPERPNT 27 (SUTURE) ×2 IMPLANT
SUTURE STRATFX 0 PDS 27 VIOLET (SUTURE) ×1 IMPLANT
SYR 50ML LL SCALE MARK (SYRINGE) IMPLANT
TRAY FOLEY MTR SLVR 16FR STAT (SET/KITS/TRAYS/PACK) ×1 IMPLANT
YANKAUER SUCT BULB TIP 10FT TU (MISCELLANEOUS) ×3 IMPLANT

## 2019-07-02 NOTE — Op Note (Signed)
OPERATIVE REPORT- TOTAL HIP ARTHROPLASTY   PREOPERATIVE DIAGNOSIS: Osteoarthritis of the Right hip.   POSTOPERATIVE DIAGNOSIS: Osteoarthritis of the Right  hip.   PROCEDURE: Right total hip arthroplasty, anterior approach.   SURGEON: Ollen Gross, MD   ASSISTANT: Arther Abbott, PA-C  ANESTHESIA:  General  ESTIMATED BLOOD LOSS:-175 mL    DRAINS: Hemovac x1.   COMPLICATIONS: None   CONDITION: PACU - hemodynamically stable.   BRIEF CLINICAL NOTE: Steven Harrington is a 74 y.o. male who has advanced end-  stage arthritis of their Right  hip with progressively worsening pain and  dysfunction.The patient has failed nonoperative management and presents for  total hip arthroplasty.   PROCEDURE IN DETAIL: After successful administration of spinal  anesthetic, the traction boots for the [facility] bed were placed on both  feet and the patient was placed onto the [facility] bed, boots placed into the leg  holders. The Right hip was then isolated from the perineum with plastic  drapes and prepped and draped in the usual sterile fashion. ASIS and  greater trochanter were marked and a oblique incision was made, starting  at about 1 cm lateral and 2 cm distal to the ASIS and coursing towards  the anterior cortex of the femur. The skin was cut with a 10 blade  through subcutaneous tissue to the level of the fascia overlying the  tensor fascia lata muscle. The fascia was then incised in line with the  incision at the junction of the anterior third and posterior 2/3rd. The  muscle was teased off the fascia and then the interval between the TFL  and the rectus was developed. The Hohmann retractor was then placed at  the top of the femoral neck over the capsule. The vessels overlying the  capsule were cauterized and the fat on top of the capsule was removed.  A Hohmann retractor was then placed anterior underneath the rectus  femoris to give exposure to the entire anterior capsule. A T-shaped   capsulotomy was performed. The edges were tagged and the femoral head  was identified.       Osteophytes are removed off the superior acetabulum.  The femoral neck was then cut in situ with an oscillating saw. Traction  was then applied to the left lower extremity utilizing the [facility]  traction. The femoral head was then removed. Retractors were placed  around the acetabulum and then circumferential removal of the labrum was  performed. Osteophytes were also removed. Reaming starts at 51 mm to  medialize and  Increased in 2 mm increments to 55 mm. We reamed in  approximately 40 degrees of abduction, 20 degrees anteversion. A 56 mm  pinnacle acetabular shell was then impacted in anatomic position under  fluoroscopic guidance with excellent purchase. We did not need to place  any additional dome screws. A 36 mm neutral + 4 marathon liner was then  placed into the acetabular shell.       The femoral lift was then placed along the lateral aspect of the femur  just distal to the vastus ridge. The leg was  externally rotated and capsule  was stripped off the inferior aspect of the femoral neck down to the  level of the lesser trochanter, this was done with electrocautery. The femur was lifted after this was performed. The  leg was then placed in an extended and adducted position essentially delivering the femur. We also removed the capsule superiorly and the piriformis from the piriformis fossa to gain excellent  exposure of the  proximal femur. Rongeur was used to remove some cancellous bone to get  into the lateral portion of the proximal femur for placement of the  initial starter reamer. The starter broaches was placed  the starter broach  and was shown to go down the center of the canal. Broaching  with the Corail system was then performed starting at size 8  coursing  Up to size 6. A size 6 had excellent torsional and rotational  and axial stability. The trial high offset neck was then placed   with a 36 + 1.5 trial head. The hip was then reduced. We confirmed that  the stem was in the canal both on AP and lateral x-rays. It also has excellent sizing. The hip was reduced with outstanding stability through full extension and full external rotation.. AP pelvis was taken and the leg lengths were measured and found to be equal. Hip was then dislocated again and the femoral head and neck removed. The  femoral broach was removed. Size 6 Corail stem with a high offset  neck was then impacted into the femur following native anteversion. Has  excellent purchase in the canal. Excellent torsional and rotational and  axial stability. It is confirmed to be in the canal on AP and lateral  fluoroscopic views. The 36 + 1.5 ceramic head was placed and the hip  reduced with outstanding stability. Again AP pelvis was taken and it  confirmed that the leg lengths were equal. The wound was then copiously  irrigated with saline solution and the capsule reattached and repaired  with Ethibond suture. 30 ml of .25% Bupivicaine was  injected into the capsule and into the edge of the tensor fascia lata as well as subcutaneous tissue. The fascia overlying the tensor fascia lata was then closed with a running #1 V-Loc. Subcu was closed with interrupted 2-0 Vicryl and subcuticular running 4-0 Monocryl. Incision was cleaned  and dried. Steri-Strips and a bulky sterile dressing applied. Hemovac  drain was hooked to suction and then the patient was awakened and transported to  recovery in stable condition.        Please note that a surgical assistant was a medical necessity for this procedure to perform it in a safe and expeditious manner. Assistant was necessary to provide appropriate retraction of vital neurovascular structures and to prevent femoral fracture and allow for anatomic placement of the prosthesis.  Gaynelle Arabian, M.D.

## 2019-07-02 NOTE — Interval H&P Note (Signed)
History and Physical Interval Note:  07/02/2019 11:25 AM  Steven Harrington  has presented today for surgery, with the diagnosis of right hip osteoarthritis.  The various methods of treatment have been discussed with the patient and family. After consideration of risks, benefits and other options for treatment, the patient has consented to  Procedure(s) with comments: TOTAL HIP ARTHROPLASTY ANTERIOR APPROACH (Right) - as a surgical intervention.  The patient's history has been reviewed, patient examined, no change in status, stable for surgery.  I have reviewed the patient's chart and labs.  Questions were answered to the patient's satisfaction.     Homero Fellers Arturo Freundlich

## 2019-07-02 NOTE — Anesthesia Procedure Notes (Signed)
Procedure Name: Intubation Date/Time: 07/02/2019 1:22 PM Performed by: Maxwell Caul, CRNA Pre-anesthesia Checklist: Patient identified, Emergency Drugs available, Suction available and Patient being monitored Patient Re-evaluated:Patient Re-evaluated prior to induction Oxygen Delivery Method: Circle system utilized Preoxygenation: Pre-oxygenation with 100% oxygen Induction Type: IV induction Ventilation: Mask ventilation without difficulty Laryngoscope Size: Mac and 4 Grade View: Grade I Tube type: Oral Tube size: 7.5 mm Number of attempts: 1 Airway Equipment and Method: Stylet Placement Confirmation: ETT inserted through vocal cords under direct vision,  positive ETCO2 and breath sounds checked- equal and bilateral Secured at: 21 cm Tube secured with: Tape Dental Injury: Teeth and Oropharynx as per pre-operative assessment

## 2019-07-02 NOTE — Discharge Instructions (Addendum)
Steven Gross, MD Total Joint Specialist [facility] [place] [address] Steven [place], [place] [address] [phone]  ANTERIOR APPROACH TOTAL HIP REPLACEMENT POSTOPERATIVE DIRECTIONS     Hip Rehabilitation, Guidelines Following Surgery  The results of a hip operation are greatly improved after range of motion and muscle strengthening exercises. Follow all safety measures which are given to protect your hip. If any of these exercises cause increased pain or swelling in your joint, decrease the amount until you are comfortable again. Then slowly increase the exercises. Call your caregiver if you have problems or questions.   BLOOD CLOT PREVENTION . Take a 325 mg Aspirin two times a day for three weeks following surgery. Then resume one 325 mg Aspirin once a day. [name] may resume your vitamins/supplements upon discharge from the hospital. . Do not take any NSAIDs (Advil, Aleve, Ibuprofen, Meloxicam, etc.) until you have discontinued the 325 mg Aspirin.  HOME CARE INSTRUCTIONS  . Remove items at home which could result in a fall. This includes throw rugs or furniture in walking pathways.   ICE to the affected hip as frequently as 20-30 minutes an hour and then as needed for pain and swelling. Continue to use ice on the hip for pain and swelling from surgery. You may notice swelling that will progress down to the foot and ankle. This is normal after surgery. Elevate the leg when you are not up walking on it.    Continue to use the breathing machine which will help keep your temperature down.  It is common for your temperature to cycle up and down following surgery, especially at night when you are not up moving around and exerting yourself.  The breathing machine keeps your lungs expanded and your temperature down.  DIET You may resume your previous home diet once your are discharged from the hospital.  DRESSING / WOUND CARE / SHOWERING . You have an adhesive waterproof  bandage over the incision. Leave this in place until your first follow-up appointment. Once you remove this you will not need to place another bandage.  . You may begin showering 3 days following surgery, but do not submerge the incision under water.  ACTIVITY . For the first 3-5 days, it is important to rest and keep the operative leg elevated. You should, as a general rule, rest for 50 minutes and walk/stretch for 10 minutes per hour. After 5 days, you may slowly increase activity as tolerated.  Marland Kitchen Perform the exercises you were provided twice a day for about 15-20 minutes each session. Begin these 2 days following surgery. . Walk with your walker as instructed. Use the walker until you are comfortable transitioning to a cane. Walk with the cane in the opposite hand of the operative leg. You may discontinue the cane once you are comfortable and walking steadily. . Avoid periods of inactivity such as sitting longer than an hour when not asleep. This helps prevent blood clots.  . Do not drive a car for 6 weeks or until released by your surgeon.  . Do not drive while taking narcotics.  TED HOSE STOCKINGS Wear the elastic stockings on both legs for three weeks following surgery during the day. You may remove them at night while sleeping.  WEIGHT BEARING Weight bearing as tolerated with assist device (walker, cane, etc) as directed, use it as long as suggested by your surgeon or therapist, typically at least 4-6 weeks.  POSTOPERATIVE CONSTIPATION PROTOCOL Constipation - defined medically as fewer than  three stools per week and severe constipation as less than one stool per week.  One of the most common issues patients have following surgery is constipation.  Even if you have a regular bowel pattern at home, your normal regimen is likely to be disrupted due to multiple reasons following surgery.  Combination of anesthesia, postoperative narcotics, change in appetite and fluid intake all can affect  your bowels.  In order to avoid complications following surgery, here are some recommendations in order to help you during your recovery period.  . Colace (docusate) - Pick up an over-the-counter form of Colace or another stool softener and take twice a day as long as you are requiring postoperative pain medications.  Take with a full glass of water daily.  If you experience loose stools or diarrhea, hold the colace until you stool forms back up.  If your symptoms do not get better within 1 week or if they get worse, check with your doctor. . Dulcolax (bisacodyl) - Pick up over-the-counter and take as directed by the product packaging as needed to assist with the movement of your bowels.  Take with a full glass of water.  Use this product as needed if not relieved by Colace only.  . MiraLax (polyethylene glycol) - Pick up over-the-counter to have on hand.  MiraLax is a solution that will increase the amount of water in your bowels to assist with bowel movements.  Take as directed and can mix with a glass of water, juice, soda, coffee, or tea.  Take if you go more than two days without a movement.Do not use MiraLax more than once per day. Call your doctor if you are still constipated or irregular after using this medication for 7 days in a row.  If you continue to have problems with postoperative constipation, please contact the office for further assistance and recommendations.  If you experience "the worst abdominal pain ever" or develop nausea or vomiting, please contact the office immediatly for further recommendations for treatment.  ITCHING  If you experience itching with your medications, try taking only a single pain pill, or even half a pain pill at a time.  You can also use Benadryl over the counter for itching or also to help with sleep.   MEDICATIONS See your medication summary on the "After Visit Summary" that the nursing staff will review with you prior to discharge.  You may have some home  medications which will be placed on hold until you complete the course of blood thinner medication.  It is important for you to complete the blood thinner medication as prescribed by your surgeon.  Continue your approved medications as instructed at time of discharge.  PRECAUTIONS If you experience chest pain or shortness of breath - call 911 immediately for transfer to the hospital emergency department.  If you develop a fever greater that 101 F, purulent drainage from wound, increased redness or drainage from wound, foul odor from the wound/dressing, or calf pain - CONTACT YOUR SURGEON.                                                   FOLLOW-UP APPOINTMENTS Make sure you keep all of your appointments after your operation with your surgeon and caregivers. You should call the office at the above phone number and make an appointment for approximately  two weeks after the date of your surgery or on the date instructed by your surgeon outlined in the "After Visit Summary".  RANGE OF MOTION AND STRENGTHENING EXERCISES  These exercises are designed to help you keep full movement of your hip joint. Follow your caregiver's or physical therapist's instructions. Perform all exercises about fifteen times, three times per day or as directed. Exercise both hips, even if you have had only one joint replacement. These exercises can be done on a training (exercise) mat, on the floor, on a table or on a bed. Use whatever works the best and is most comfortable for you. Use music or television while you are exercising so that the exercises are a pleasant break in your day. This will make your life better with the exercises acting as a break in routine you can look forward to.  . Lying on your back, slowly slide your foot toward your buttocks, raising your knee up off the floor. Then slowly slide your foot back down until your leg is straight again.  . Lying on your back spread your legs as far apart as you can without  causing discomfort.  . Lying on your side, raise your upper leg and foot straight up from the floor as far as is comfortable. Slowly lower the leg and repeat.  . Lying on your back, tighten up the muscle in the front of your thigh (quadriceps muscles). You can do this by keeping your leg straight and trying to raise your heel off the floor. This helps strengthen the largest muscle supporting your knee.  . Lying on your back, tighten up the muscles of your buttocks both with the legs straight and with the knee bent at a comfortable angle while keeping your heel on the floor.   IF YOU ARE TRANSFERRED TO A SKILLED REHAB FACILITY If the patient is transferred to a skilled rehab facility following release from the hospital, a list of the current medications will be sent to the facility for the patient to continue.  When discharged from the skilled rehab facility, please have the facility set up the patient's [place] prior to being released. Also, the skilled facility will be responsible for providing the patient with their medications at time of release from the facility to include their pain medication, the muscle relaxants, and their blood thinner medication. If the patient is still at the rehab facility at time of the two week follow up appointment, the skilled rehab facility will also need to assist the patient in arranging follow up appointment in our office and any transportation needs.  MAKE SURE YOU:  . Understand these instructions.  . Get help right away if you are not doing well or get worse.    Pick up stool softner and laxative for home use following surgery while on pain medications. Do not submerge incision under water. Please use good hand washing techniques while changing dressing each day. May shower starting three days after surgery. Please use a clean towel to pat the incision dry following showers. Continue to use ice for pain and swelling after surgery. Do not  use any lotions or creams on the incision until instructed by your surgeon.

## 2019-07-02 NOTE — Transfer of Care (Signed)
Immediate Anesthesia Transfer of Care Note  Patient: Steven Harrington  Procedure(s) Performed: TOTAL HIP ARTHROPLASTY ANTERIOR APPROACH (Right Hip)  Patient Location: PACU  Anesthesia Type:General  Level of Consciousness: awake, alert  and oriented  Airway & Oxygen Therapy: Patient Spontanous Breathing and Patient connected to face mask oxygen  Post-op Assessment: Report given to RN and Post -op Vital signs reviewed and stable  Post vital signs: Reviewed and stable  Last Vitals:  Vitals Value Taken Time  BP 146/91 07/02/19 1454  Temp    Pulse 69 07/02/19 1456  Resp 13 07/02/19 1456  SpO2 100 % 07/02/19 1456  Vitals shown include unvalidated device data.  Last Pain:  Vitals:   07/02/19 1123  TempSrc: Oral  PainSc:          Complications: No apparent anesthesia complications

## 2019-07-02 NOTE — Anesthesia Preprocedure Evaluation (Signed)
Anesthesia Evaluation  Patient identified by MRN, date of birth, ID band Patient awake    Reviewed: Allergy & Precautions, NPO status , Patient's Chart, lab work & pertinent test results  Airway Mallampati: II  TM Distance: >3 FB     Dental   Pulmonary    breath sounds clear to auscultation       Cardiovascular hypertension,  Rhythm:Regular Rate:Normal     Neuro/Psych    GI/Hepatic negative GI ROS, Neg liver ROS,   Endo/Other  negative endocrine ROS  Renal/GU negative Renal ROS     Musculoskeletal  (+) Arthritis ,   Abdominal   Peds  Hematology   Anesthesia Other Findings   Reproductive/Obstetrics                             Anesthesia Physical Anesthesia Plan  ASA: III  Anesthesia Plan: Spinal   Post-op Pain Management:    Induction: Intravenous  PONV Risk Score and Plan: 2 and Ondansetron and Dexamethasone  Airway Management Planned: Nasal Cannula and Simple Face Mask  Additional Equipment:   Intra-op Plan:   Post-operative Plan:   Informed Consent:     Dental advisory given  Plan Discussed with: CRNA and Anesthesiologist  Anesthesia Plan Comments:         Anesthesia Quick Evaluation

## 2019-07-02 NOTE — Anesthesia Postprocedure Evaluation (Signed)
Anesthesia Post Note  Patient: Steven Harrington  Procedure(s) Performed: TOTAL HIP ARTHROPLASTY ANTERIOR APPROACH (Right Hip)     Patient location during evaluation: PACU Anesthesia Type: Spinal Level of consciousness: awake Pain management: pain level controlled Vital Signs Assessment: post-procedure vital signs reviewed and stable Respiratory status: spontaneous breathing Cardiovascular status: stable Postop Assessment: no apparent nausea or vomiting Anesthetic complications: no    Last Vitals:  Vitals:   07/02/19 1530 07/02/19 1542  BP: (!) 159/87 (!) 156/77  Pulse: 72 71  Resp: 18 16  Temp:    SpO2: 100% 100%    Last Pain:  Vitals:   07/02/19 1515  TempSrc:   PainSc: 5                  Melvine Julin

## 2019-07-03 ENCOUNTER — Encounter: Payer: Self-pay | Admitting: *Deleted

## 2019-07-03 DIAGNOSIS — M169 Osteoarthritis of hip, unspecified: Secondary | ICD-10-CM

## 2019-07-03 DIAGNOSIS — I10 Essential (primary) hypertension: Secondary | ICD-10-CM | POA: Diagnosis not present

## 2019-07-03 DIAGNOSIS — Z96642 Presence of left artificial hip joint: Secondary | ICD-10-CM | POA: Diagnosis not present

## 2019-07-03 DIAGNOSIS — Z96651 Presence of right artificial knee joint: Secondary | ICD-10-CM | POA: Diagnosis not present

## 2019-07-03 DIAGNOSIS — M1611 Unilateral primary osteoarthritis, right hip: Secondary | ICD-10-CM | POA: Diagnosis not present

## 2019-07-03 LAB — BASIC METABOLIC PANEL
Anion gap: 6 (ref 5–15)
BUN: 13 mg/dL (ref 8–23)
CO2: 27 mmol/L (ref 22–32)
Calcium: 8.7 mg/dL — ABNORMAL LOW (ref 8.9–10.3)
Chloride: 106 mmol/L (ref 98–111)
Creatinine, Ser: 0.72 mg/dL (ref 0.61–1.24)
GFR calc Af Amer: >60 mL/min (ref >60)
GFR calc non Af Amer: >60 mL/min (ref >60)
Glucose, Bld: 129 mg/dL — ABNORMAL HIGH (ref 70–99)
Potassium: 4 mmol/L (ref 3.5–5.1)
Sodium: 139 mmol/L (ref 135–145)

## 2019-07-03 LAB — MAGNESIUM: Magnesium: 2.1 mg/dL (ref 1.7–2.4)

## 2019-07-03 LAB — CBC
HCT: 41.8 % (ref 39.0–52.0)
Hemoglobin: 13.5 g/dL (ref 13.0–17.0)
MCH: 32.5 pg (ref 26.0–34.0)
MCHC: 32.3 g/dL (ref 30.0–36.0)
MCV: 100.5 fL — ABNORMAL HIGH (ref 80.0–100.0)
Platelets: 191 10*3/uL (ref 150–400)
RBC: 4.16 MIL/uL — ABNORMAL LOW (ref 4.22–5.81)
RDW: 12.2 % (ref 11.5–15.5)
WBC: 7.9 10*3/uL (ref 4.0–10.5)
nRBC: 0 % (ref 0.0–0.2)

## 2019-07-03 MED ORDER — METHOCARBAMOL 500 MG PO TABS: 500.0000 mg | ORAL_TABLET | Freq: Four times a day (QID) | ORAL | 0 refills | Status: AC | PRN

## 2019-07-03 MED ORDER — TRAMADOL HCL 50 MG PO TABS: 50.0000 mg | ORAL_TABLET | Freq: Four times a day (QID) | ORAL | 0 refills | Status: AC | PRN

## 2019-07-03 MED ORDER — ASPIRIN 325 MG PO TBEC: 325.0000 mg | DELAYED_RELEASE_TABLET | Freq: Two times a day (BID) | ORAL | 0 refills | Status: AC

## 2019-07-03 MED ORDER — HYDROCODONE-ACETAMINOPHEN 5-325 MG PO TABS: 1.0000 | ORAL_TABLET | Freq: Four times a day (QID) | ORAL | 0 refills | Status: AC | PRN

## 2019-07-03 NOTE — Progress Notes (Signed)
   Subjective: 1 Day Post-Op Procedure(s) (LRB): TOTAL HIP ARTHROPLASTY ANTERIOR APPROACH (Right) Patient reports pain as mild.   Patient seen in rounds by Dr. [name]. Patient is well, and has had no acute complaints or problems other than discomfort in the right hip. No acute events overnight. Foley catheter removed, positive flatus. Denies CP, SHOB, N/V.  We will start therapy today.   Objective: Vital signs in last 24 hours: Temp:  [97.8 F (36.6 C)-98.7 F (37.1 C)] 97.9 F (36.6 C) (03/25 0604) Pulse Rate:  [44-99] 44 (03/25 0604) Resp:  [9-18] 16 (03/25 0604) BP: (130-166)/(74-102) 166/93 (03/25 0604) SpO2:  [96 %-100 %] 100 % (03/25 0604) Weight:  [112.2 kg] 112.2 kg (03/24 1800)  Intake/Output from previous day:  Intake/Output Summary (Last 24 hours) at 07/03/2019 0753 Last data filed at 07/03/2019 0700 Gross per 24 hour  Intake 3323.54 ml  Output 2650 ml  Net 673.54 ml     Intake/Output this shift: No intake/output data recorded.  Labs: Recent Labs    07/03/19 0254  HGB 13.5   Recent Labs    07/03/19 0254  WBC 7.9  RBC 4.16*  HCT 41.8  PLT 191   Recent Labs    07/03/19 0254  NA 139  K 4.0  CL 106  CO2 27  BUN 13  CREATININE 0.72  GLUCOSE 129*  CALCIUM 8.7*   No results for input(s): LABPT, INR in the last 72 hours.  Exam: General - Patient is Alert and Oriented Extremity - Neurologically intact Sensation intact distally Intact pulses distally Dorsiflexion/Plantar flexion intact Dressing - dressing C/D/I Motor Function - intact, moving foot and toes well on exam.   Past Medical History:  Diagnosis Date  . Arthritis   . Hypertension     Assessment/Plan: 1 Day Post-Op Procedure(s) (LRB): TOTAL HIP ARTHROPLASTY ANTERIOR APPROACH (Right) Principal Problem:   OA (osteoarthritis) of hip Active Problems:   Primary osteoarthritis of right hip  Estimated body mass index is 33.56 kg/m as calculated from the following:   Height as of  this encounter: 6' (1.829 m).   Weight as of this encounter: 112.2 kg. Advance diet Up with therapy D/C IV fluids  DVT Prophylaxis - Aspirin Weight bearing as tolerated. D/C O2 and pulse ox and try on room air. Hemovac pulled without difficulty, will begin therapy.  Plan is to go Home after hospital stay. Plan for discharge today following 1-2 sessions of therapy as long as he continues to do well and is meeting his goals. Plan for HEP. Leave aquacel dressing in place until follow up. Follow up with Dr. [name] in 2 weeks.   Dennie Bible, PA-C Orthopedic Surgery [phone] 07/03/2019, 7:53 AM

## 2019-07-03 NOTE — Evaluation (Signed)
Physical Therapy Evaluation Patient Details Name: Steven Harrington MRN: [id number] DOB: Aug 24, 1945 Today's Date: 07/03/2019   History of Present Illness  Pt s/p R THR and with hx of L THR and R TKR  Clinical Impression  Pt s/p R THR and presents with decreased R LE strength/ROM and post op pain limiting functional mobility.  Pt should progress to dc home with family assist.  OOB activity deferred this session 2* pt HR fluctuating from 29 to 100 at rest - will mobilize after EKG.    Follow Up Recommendations Follow surgeon's recommendation for DC plan and follow-up therapies    Equipment Recommendations  None recommended by PT    Recommendations for Other Services       Precautions / Restrictions Precautions Precautions: Fall;Other (comment) Precaution Comments: HR fluctuating between 29 and 100 when vitals being taken Restrictions Weight Bearing Restrictions: No Other Position/Activity Restrictions: WBAT      Mobility  Bed Mobility               General bed mobility comments: NT - deferred to after EKG  Transfers                    Ambulation/Gait                Stairs            Wheelchair Mobility    Modified Rankin (Stroke Patients Only)       Balance                                             Pertinent Vitals/Pain Pain Assessment: 0-10 Pain Score: 5  Pain Location: R hip  Pain Descriptors / Indicators: Aching;Burning;Sore Pain Intervention(s): Monitored during session;Limited activity within patient's tolerance;Premedicated before session    Home Living Family/patient expects to be discharged to:: Private residence Living Arrangements: Spouse/significant other Available Help at Discharge: Family Type of Home: House Home Access: Stairs to enter Entrance Stairs-Rails: None Entrance Stairs-Number of Steps: 2 Home Layout: One level Home Equipment: Environmental consultant - 2 wheels;Cane - single point;Bedside commode       Prior Function Level of Independence: Independent with assistive device(s)         Comments: using SPC as needed     Hand Dominance        Extremity/Trunk Assessment   Upper Extremity Assessment Upper Extremity Assessment: Overall WFL for tasks assessed    Lower Extremity Assessment Lower Extremity Assessment: RLE deficits/detail RLE Deficits / Details: AAROM at hip to 15 abd and 75 flex; strength at hip 2+/5       Communication   Communication: No difficulties  Cognition Arousal/Alertness: Awake/alert Behavior During Therapy: WFL for tasks assessed/performed Overall Cognitive Status: Within Functional Limits for tasks assessed                                        General Comments      Exercises Total Joint Exercises Ankle Circles/Pumps: AROM;Both;15 reps;Supine Quad Sets: AROM;Both;10 reps;Supine Heel Slides: AAROM;Right;10 reps;Supine Hip ABduction/ADduction: AAROM;Right;10 reps;Supine Long Arc Quad: AROM;Right;10 reps;Seated   Assessment/Plan    PT Assessment Patient needs continued PT services  PT Problem List Decreased strength;Decreased range of motion;Decreased activity tolerance;Decreased balance;Decreased mobility;Decreased knowledge of use of DME;Obesity;Pain  PT Treatment Interventions DME instruction;Gait training;Stair training;Functional mobility training;Therapeutic activities;Therapeutic exercise;Balance training;Patient/family education    PT Goals (Current goals can be found in the Care Plan section)  Acute Rehab PT Goals Patient Stated Goal: Regain IND PT Goal Formulation: With patient Time For Goal Achievement: 07/10/19 Potential to Achieve Goals: Good    Frequency 7X/week   Barriers to discharge        Co-evaluation               AM-PAC PT "6 Clicks" Mobility  Outcome Measure Help needed turning from your back to your side while in a flat bed without using bedrails?: A Little Help needed  moving from lying on your back to sitting on the side of a flat bed without using bedrails?: A Little Help needed moving to and from a bed to a chair (including a wheelchair)?: A Little Help needed standing up from a chair using your arms (e.g., wheelchair or bedside chair)?: A Little Help needed to walk in hospital room?: A Little Help needed climbing 3-5 steps with a railing? : A Little 6 Click Score: 18    End of Session   Activity Tolerance: Patient tolerated treatment well Patient left: in bed;with call bell/phone within reach;with nursing/sitter in room Nurse Communication: Other (comment)(HR changes) PT Visit Diagnosis: Difficulty in walking, not elsewhere classified (R26.2)    Time: 4827-0786 PT Time Calculation (min) (ACUTE ONLY): 11 min   Charges:   PT Evaluation $PT Eval Low Complexity: 1 Low          [place] Pager [phone] Office [phone]   Kyah Buesing 07/03/2019, 1:18 PM

## 2019-07-03 NOTE — Plan of Care (Signed)
  Problem: Education: Goal: Knowledge of General Education information will improve Description: Including pain rating scale, medication(s)/side effects and non-pharmacologic comfort measures 07/03/2019 1504 by Iantha Fallen, RN Outcome: Adequate for Discharge 07/03/2019 1503 by Iantha Fallen, RN Outcome: Progressing   Problem: Health Behavior/Discharge Planning: Goal: Ability to manage health-related needs will improve 07/03/2019 1504 by Iantha Fallen, RN Outcome: Adequate for Discharge 07/03/2019 1503 by Iantha Fallen, RN Outcome: Progressing   Problem: Clinical Measurements: Goal: Ability to maintain clinical measurements within normal limits will improve 07/03/2019 1504 by Iantha Fallen, RN Outcome: Adequate for Discharge 07/03/2019 1503 by Iantha Fallen, RN Outcome: Progressing Goal: Will remain free from infection 07/03/2019 1504 by Iantha Fallen, RN Outcome: Adequate for Discharge 07/03/2019 1503 by Iantha Fallen, RN Outcome: Progressing Goal: Diagnostic test results will improve 07/03/2019 1504 by Iantha Fallen, RN Outcome: Adequate for Discharge 07/03/2019 1503 by Iantha Fallen, RN Outcome: Progressing Goal: Respiratory complications will improve Outcome: Adequate for Discharge Goal: Cardiovascular complication will be avoided Outcome: Adequate for Discharge   Problem: Activity: Goal: Risk for activity intolerance will decrease Outcome: Adequate for Discharge   Problem: Nutrition: Goal: Adequate nutrition will be maintained Outcome: Adequate for Discharge   Problem: Coping: Goal: Level of anxiety will decrease Outcome: Adequate for Discharge   Problem: Elimination: Goal: Will not experience complications related to bowel motility Outcome: Adequate for Discharge Goal: Will not experience complications related to urinary retention Outcome: Adequate for Discharge   Problem: Pain Managment: Goal: General experience of comfort will  improve Outcome: Adequate for Discharge   Problem: Safety: Goal: Ability to remain free from injury will improve Outcome: Adequate for Discharge   Problem: Skin Integrity: Goal: Risk for impaired skin integrity will decrease Outcome: Adequate for Discharge

## 2019-07-03 NOTE — Evaluation (Signed)
Physical Therapy Evaluation Patient Details Name: Steven Harrington MRN: [id number] DOB: 11/09/1945 Today's Date: 07/03/2019   History of Present Illness  Pt s/p R THR and with hx of L THR and R TKR  Clinical Impression  Pt mobilizing in hall with min guard and hopeful for dc home this pm.      Follow Up Recommendations Follow surgeon's recommendation for DC plan and follow-up therapies    Equipment Recommendations  None recommended by PT    Recommendations for Other Services       Precautions / Restrictions Precautions Precautions: Fall;Other (comment) Precaution Comments: OK to proceed with PT per PA following EKG Restrictions Weight Bearing Restrictions: No Other Position/Activity Restrictions: WBAT      Mobility  Bed Mobility Overal bed mobility: Needs Assistance Bed Mobility: Supine to Sit     Supine to sit: Min assist     General bed mobility comments: cues for sequence with assist to manage R LE  Transfers Overall transfer level: Needs assistance Equipment used: Rolling walker (2 wheeled) Transfers: Sit to/from Stand Sit to Stand: Min guard         General transfer comment: cues for LE management and use of UEs to self assist  Ambulation/Gait Ambulation/Gait assistance: Min guard Gait Distance (Feet): 120 Feet Assistive device: Rolling walker (2 wheeled) Gait Pattern/deviations: Step-to pattern;Step-through pattern;Decreased step length - right;Decreased step length - left;Shuffle;Trunk flexed;Antalgic Gait velocity: decr   General Gait Details: cues for posture, position from RW and initial sequence  Stairs            Wheelchair Mobility    Modified Rankin (Stroke Patients Only)       Balance Overall balance assessment: Mild deficits observed, not formally tested                                           Pertinent Vitals/Pain Pain Assessment: 0-10 Pain Score: 5  Pain Location: R hip  Pain Descriptors /  Indicators: Aching;Burning;Sore Pain Intervention(s): Limited activity within patient's tolerance;Monitored during session;Premedicated before session;Ice applied    Home Living                        Prior Function                 Hand Dominance        Extremity/Trunk Assessment                Communication      Cognition Arousal/Alertness: Awake/alert Behavior During Therapy: WFL for tasks assessed/performed Overall Cognitive Status: Within Functional Limits for tasks assessed                                        General Comments      Exercises     Assessment/Plan    PT Assessment    PT Problem List         PT Treatment Interventions      PT Goals (Current goals can be found in the Care Plan section)  Acute Rehab PT Goals Patient Stated Goal: Regain IND PT Goal Formulation: With patient Time For Goal Achievement: 07/10/19 Potential to Achieve Goals: Good    Frequency 7X/week   Barriers to discharge  Co-evaluation               AM-PAC PT "6 Clicks" Mobility  Outcome Measure Help needed turning from your back to your side while in a flat bed without using bedrails?: A Little Help needed moving from lying on your back to sitting on the side of a flat bed without using bedrails?: A Little Help needed moving to and from a bed to a chair (including a wheelchair)?: A Little Help needed standing up from a chair using your arms (e.g., wheelchair or bedside chair)?: A Little Help needed to walk in hospital room?: A Little Help needed climbing 3-5 steps with a railing? : A Little 6 Click Score: 18    End of Session Equipment Utilized During Treatment: Gait belt Activity Tolerance: Patient tolerated treatment well Patient left: in chair;with call bell/phone within reach;with chair alarm set Nurse Communication: Mobility status PT Visit Diagnosis: Difficulty in walking, not elsewhere classified (R26.2)     Time: 7106-2694 PT Time Calculation (min) (ACUTE ONLY): 22 min   Charges:   PT Evaluation $PT Eval Low Complexity: 1 Low PT Treatments $Gait Training: 8-22 mins        [place] Pager [phone] Office [phone]   Redmond Whittley 07/03/2019, 1:25 PM

## 2019-07-03 NOTE — Progress Notes (Signed)
Physical Therapy Treatment Patient Details Name: Steven Harrington MRN: [id number] DOB: Jun 16, 1945 Today's Date: 07/03/2019    History of Present Illness Pt s/p R THR and with hx of L THR and R TKR    PT Comments    Pt progressing well with mobility and eager for dc home.  Pt reviewed stairs, car transfers and written HEP.   Follow Up Recommendations  Follow surgeon's recommendation for DC plan and follow-up therapies     Equipment Recommendations  None recommended by PT    Recommendations for Other Services       Precautions / Restrictions Precautions Precautions: Fall Precaution Comments: OK to proceed with PT per PA following EKG Restrictions Weight Bearing Restrictions: No Other Position/Activity Restrictions: WBAT    Mobility  Bed Mobility Overal bed mobility: Needs Assistance Bed Mobility: Sit to Supine     Supine to sit: Min assist Sit to supine: Supervision   General bed mobility comments: cues for sequence  Transfers Overall transfer level: Needs assistance Equipment used: Rolling walker (2 wheeled) Transfers: Sit to/from Stand Sit to Stand: Supervision         General transfer comment: cues for LE management and use of UEs to self assist  Ambulation/Gait Ambulation/Gait assistance: Min guard;Supervision Gait Distance (Feet): 150 Feet Assistive device: Rolling walker (2 wheeled) Gait Pattern/deviations: Step-to pattern;Step-through pattern;Decreased step length - right;Decreased step length - left;Shuffle;Trunk flexed;Antalgic Gait velocity: decr   General Gait Details: cues for posture, position from RW and initial sequence   Stairs Stairs: Yes Stairs assistance: Min guard Stair Management: No rails;Step to pattern;Forwards;With walker Number of Stairs: 4 General stair comments: 2 step and single step twice with RW; cues for sequence    Wheelchair Mobility    Modified Rankin (Stroke Patients Only)       Balance Overall balance  assessment: Mild deficits observed, not formally tested                                          Cognition Arousal/Alertness: Awake/alert Behavior During Therapy: WFL for tasks assessed/performed Overall Cognitive Status: Within Functional Limits for tasks assessed                                        Exercises      General Comments        Pertinent Vitals/Pain Pain Assessment: 0-10 Pain Score: 4  Pain Location: R hip  Pain Descriptors / Indicators: Aching;Burning;Sore Pain Intervention(s): Limited activity within patient's tolerance;Monitored during session;Premedicated before session    Home Living                      Prior Function            PT Goals (current goals can now be found in the care plan section) Acute Rehab PT Goals Patient Stated Goal: Regain IND PT Goal Formulation: With patient Time For Goal Achievement: 07/10/19 Potential to Achieve Goals: Good Progress towards PT goals: Progressing toward goals    Frequency    7X/week      PT Plan Current plan remains appropriate    Co-evaluation              AM-PAC PT "6 Clicks" Mobility   Outcome Measure  Help needed turning from your  back to your side while in a flat bed without using bedrails?: A Little Help needed moving from lying on your back to sitting on the side of a flat bed without using bedrails?: A Little Help needed moving to and from a bed to a chair (including a wheelchair)?: A Little Help needed standing up from a chair using your arms (e.g., wheelchair or bedside chair)?: A Little Help needed to walk in hospital room?: A Little Help needed climbing 3-5 steps with a railing? : A Little 6 Click Score: 18    End of Session Equipment Utilized During Treatment: Gait belt Activity Tolerance: Patient tolerated treatment well Patient left: with call bell/phone within reach;in bed Nurse Communication: Mobility status PT Visit Diagnosis:  Difficulty in walking, not elsewhere classified (R26.2)     Time: 4497-5300 PT Time Calculation (min) (ACUTE ONLY): 29 min  Charges:  $Gait Training: 8-22 mins $Therapeutic Activity: 8-22 mins                     [place] Pager [phone] Office [phone]    Aldrich Lloyd 07/03/2019, 2:45 PM

## 2019-07-03 NOTE — Plan of Care (Signed)

## 2019-07-03 NOTE — Consult Note (Signed)
Medical Consultation   [name]  FBP:[id number]  DOB: [birth year]  DOA: 07/02/2019  PCP: Jani Gravel. (Inactive)   Outpatient Specialists: Ortho   Requesting physician: Dr. Wynelle Link  Reason for consultation: Occasional PVCs   History of Present Illness: Steven Harrington is an 74 y.o. 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  Recovering well.  Orthopedic surgery team consulted [facility] due to occasional PVCs.  Personally reviewed twelve-lead EKG done on 07/03/19.  Patient in sinus rhythm with occasional PVCs.  No personal or family history of structural heart disease.  Seen and examined in his room.  PT present in the room.  Denies any chest pain or fluttering.  No reported dizziness.  Asymptomatic.  States he feels great.  Eager to be discharged today.  Potassium level at goal 4.0.  Magnesium level added on, no need to draw additional blood for this.   Review of Systems:  ROS As per HPI otherwise 10 point review of systems negative.     Past Medical History: Past Medical History:  Diagnosis Date  . Arthritis   . Hypertension     Past Surgical History: Past Surgical History:  Procedure Laterality Date  . EYE SURGERY    . JOINT REPLACEMENT Left    L Hip, and R Knee  . PROSTATE SURGERY     Laser  . TOTAL HIP ARTHROPLASTY Right 07/02/2019   Procedure: TOTAL HIP ARTHROPLASTY ANTERIOR APPROACH;  Surgeon: Gaynelle Arabian, MD;  Location: [facility];  Service: Orthopedics;  Laterality: Right;  17min     Allergies:  No Known Allergies   Social History:  reports that he has never smoked. He has never used smokeless tobacco. He reports current alcohol use of about 3.0 standard drinks of alcohol per week. He reports that he does not use drugs.   Family History: History reviewed. No pertinent family history.  Unacceptable: Noncontributory,  unremarkable, or negative. Acceptable: Family history reviewed and not pertinent (If you reviewed it)   Physical Exam: Vitals:   07/02/19 2218 07/03/19 0208 07/03/19 0604 07/03/19 0925  BP: (!) 145/89 130/85 (!) 166/93 (!) 147/90  Pulse: 90 72 (!) 44 69  Resp: 14 14 16 16   Temp: 98.7 F (37.1 C) 97.9 F (36.6 C) 97.9 F (36.6 C) 98.3 F (36.8 C)  TempSrc: Oral Oral Oral   SpO2: 96% 97% 100% 100%  Weight:      Height:        Constitutional: Alert and awake, oriented x3, not in any acute distress. Eyes: PERLA, EOMI, irises appear normal, anicteric sclera,  ENMT: external ears and nose appear normal. Lips appears normal, oropharynx mucosa, tongue, posterior pharynx appear normal  Neck: neck appears normal, no masses, normal ROM, no thyromegaly, no JVD  CVS: S1-S2 clear, no murmur rubs or gallops, no LE edema, normal pedal pulses  Respiratory:  clear to auscultation bilaterally, no wheezing, rales or rhonchi. Respiratory effort normal. No accessory muscle use.  Abdomen: soft nontender, nondistended, normal bowel sounds, no hepatosplenomegaly, no hernias  Musculoskeletal: : no cyanosis, clubbing or edema noted bilaterally Neuro: Cranial nerves II-XII intact, strength, sensation, reflexes Psych: judgement and insight appear normal, stable mood and affect, mental status Skin: no rashes or lesions or ulcers, no induration or nodules    Data reviewed:  I have personally reviewed following labs and imaging  studies Labs:  CBC: Recent Labs  Lab 07/03/19 0254  WBC 7.9  HGB 13.5  HCT 41.8  MCV 100.5*  PLT 191    Basic Metabolic Panel: Recent Labs  Lab 07/03/19 0254  NA 139  K 4.0  CL 106  CO2 27  GLUCOSE 129*  BUN 13  CREATININE 0.72  CALCIUM 8.7*   GFR Estimated Creatinine Clearance: 107.9 mL/min (by C-G formula based on SCr of 0.72 mg/dL). Liver Function Tests: No results for input(s): AST, ALT, ALKPHOS, BILITOT, PROT, ALBUMIN in the last 168 hours. No results for  input(s): LIPASE, AMYLASE in the last 168 hours. No results for input(s): AMMONIA in the last 168 hours. Coagulation profile No results for input(s): INR, PROTIME in the last 168 hours.  Cardiac Enzymes: No results for input(s): CKTOTAL, CKMB, CKMBINDEX, TROPONINI in the last 168 hours. BNP: Invalid input(s): POCBNP CBG: No results for input(s): GLUCAP in the last 168 hours. D-Dimer No results for input(s): DDIMER in the last 72 hours. Hgb A1c No results for input(s): HGBA1C in the last 72 hours. Lipid Profile No results for input(s): CHOL, HDL, LDLCALC, TRIG, CHOLHDL, LDLDIRECT in the last 72 hours. Thyroid function studies No results for input(s): TSH, T4TOTAL, T3FREE, THYROIDAB in the last 72 hours.  Invalid input(s): FREET3 Anemia work up No results for input(s): VITAMINB12, FOLATE, FERRITIN, TIBC, IRON, RETICCTPCT in the last 72 hours. Urinalysis    Component Value Date/Time   COLORURINE YELLOW 05/05/2010 1520   APPEARANCEUR CLEAR 05/05/2010 1520   LABSPEC 1.014 05/05/2010 1520   PHURINE 6.0 05/05/2010 1520   GLUCOSEU NEGATIVE 09/23/2009 1030   HGBUR NEGATIVE 05/05/2010 1520   BILIRUBINUR NEGATIVE 05/05/2010 1520   KETONESUR NEGATIVE 05/05/2010 1520   PROTEINUR NEGATIVE 05/05/2010 1520   UROBILINOGEN 0.2 05/05/2010 1520   NITRITE NEGATIVE 05/05/2010 1520   LEUKOCYTESUR  05/05/2010 1520    NEGATIVE MICROSCOPIC NOT DONE ON URINES WITH NEGATIVE PROTEIN, BLOOD, LEUKOCYTES, NITRITE, OR GLUCOSE <1000 mg/dL.     Microbiology Recent Results (from the past 240 hour(s))  Surgical pcr screen     Status: Abnormal   Collection Time: 06/25/19  1:31 PM   Specimen: Nasal Mucosa; Nasal Swab  Result Value Ref Range Status   MRSA, PCR NEGATIVE NEGATIVE Final   Staphylococcus aureus POSITIVE (A) NEGATIVE Final    Comment: (NOTE) The Xpert SA Assay (FDA approved for NASAL specimens in patients 51 years of age and older), is one component of a comprehensive surveillance program.  It is not intended to diagnose infection nor to guide or monitor treatment. Performed at [facility], [address], [place], [place] [address]   SARS CORONAVIRUS 2 (TAT 6-24 HRS) Nasopharyngeal Nasopharyngeal Swab     Status: None   Collection Time: 06/28/19  1:33 PM   Specimen: Nasopharyngeal Swab  Result Value Ref Range Status   SARS Coronavirus 2 NEGATIVE NEGATIVE Final    Comment: (NOTE) SARS-CoV-2 target nucleic acids are NOT DETECTED. The SARS-CoV-2 RNA is generally detectable in upper and lower respiratory specimens during the acute phase of infection. Negative results do not preclude SARS-CoV-2 infection, do not rule out co-infections with other pathogens, and should not be used as the sole basis for treatment or other patient management decisions. Negative results must be combined with clinical observations, patient history, and epidemiological information. The expected result is Negative. Fact Sheet for Patients: HairSlick.no Fact Sheet for Healthcare Providers: quierodirigir.com This test is not yet approved or cleared by the [place] FDA  and  has been authorized for detection and/or diagnosis of SARS-CoV-2 by FDA under an Emergency Use Authorization (EUA). This EUA will remain  in effect (meaning this test can be used) for the duration of the COVID-19 declaration under Section 56 4(b)(1) of the Act, 21 U.S.C. section 360bbb-3(b)(1), unless the authorization is terminated or revoked sooner. Performed at [facility], [address], [place], [place] [address]        Inpatient Medications:   Scheduled Meds: . aspirin EC  325 mg Oral BID  . docusate sodium  100 mg Oral BID   Continuous Infusions: . sodium chloride 75 mL/hr at 07/03/19 0129  . methocarbamol (ROBAXIN) IV 500 mg (07/02/19 1506)     Radiological Exams on Admission: DG Pelvis Portable  Result Date:  07/02/2019 CLINICAL DATA:  Surgery, elective. Additional provided: Fluoroscopy time 10 seconds EXAM: OPERATIVE right HIP (WITH PELVIS IF PERFORMED) single VIEWS TECHNIQUE: Fluoroscopic spot image(s) were submitted for interpretation post-operatively. COMPARISON:  Radiographs of the left hip 10/04/2009 FINDINGS: A single AP radiograph of the bilateral hips and pelvis is submitted. Additionally, four intra-procedural fluoroscopic images of the hips are submitted. The images demonstrate interval right hip arthroplasty. The femoral and acetabular components appear well seated. No adverse features. Redemonstrated left hip arthroplasty. IMPRESSION: Interval right hip arthroplasty.  No adverse features. Electronically Signed   By: Jackey Loge DO   On: 07/02/2019 15:38   DG C-Arm 1-60 Min-No Report  Result Date: 07/02/2019 Fluoroscopy was utilized by the requesting physician.  No radiographic interpretation.   DG HIP OPERATIVE UNILAT W OR W/O PELVIS RIGHT  Result Date: 07/02/2019 CLINICAL DATA:  Surgery, elective. Additional provided: Fluoroscopy time 10 seconds EXAM: OPERATIVE right HIP (WITH PELVIS IF PERFORMED) single VIEWS TECHNIQUE: Fluoroscopic spot image(s) were submitted for interpretation post-operatively. COMPARISON:  Radiographs of the left hip 10/04/2009 FINDINGS: A single AP radiograph of the bilateral hips and pelvis is submitted. Additionally, four intra-procedural fluoroscopic images of the hips are submitted. The images demonstrate interval right hip arthroplasty. The femoral and acetabular components appear well seated. No adverse features. Redemonstrated left hip arthroplasty. IMPRESSION: Interval right hip arthroplasty.  No adverse features. Electronically Signed   By: Jackey Loge DO   On: 07/02/2019 15:38    Impression/Recommendations Principal Problem:   OA (osteoarthritis) of hip Active Problems:   Primary osteoarthritis of right hip  Occasional PVCs, asymptomatic Personally  reviewed 12 lead EKG 07/03/19 No chest pain, no palpitations, no dizziness No personal or family history of structural heart disease K+ 4.0 at goal Added on serum Magnesium.  No need for additional blood draws, test can be added to his most recent blood draw.  Goal serum magnesium 2.0. Can be discharged from a medical standpoint. Can follow up with his PCP in 1-2 weeks.    Thank you for this consultation.  Our [facility] hospitalist team will sign off.  Time Spent: 25 minutes  Darlin Drop M.D. [facility] 07/03/2019, 1:59 PM

## 2021-08-01 IMAGING — RF DG HIP (WITH PELVIS) OPERATIVE*R*
1 series · 5 of 5 positions shown · non-contrast
Comparison: Radiographs of the left hip 10/04/2009

CLINICAL DATA: Surgery, elective. Additional provided: Fluoroscopy
time 10 seconds

EXAM:
OPERATIVE right HIP (WITH PELVIS IF PERFORMED) single VIEWS
TECHNIQUE: Fluoroscopic spot image(s) were submitted for interpretation
post-operatively.

[Series 1: unknown protocol · 0.20mm/px · 5 of 5 slices shown]
[im 1/5]
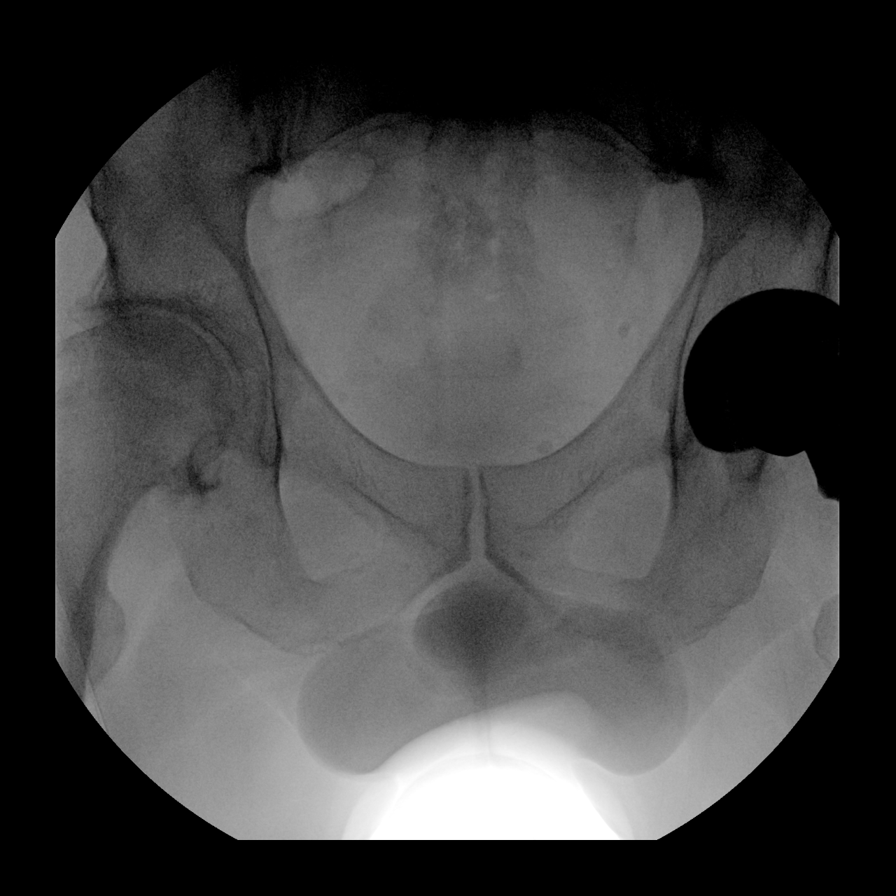
[im 2/5]
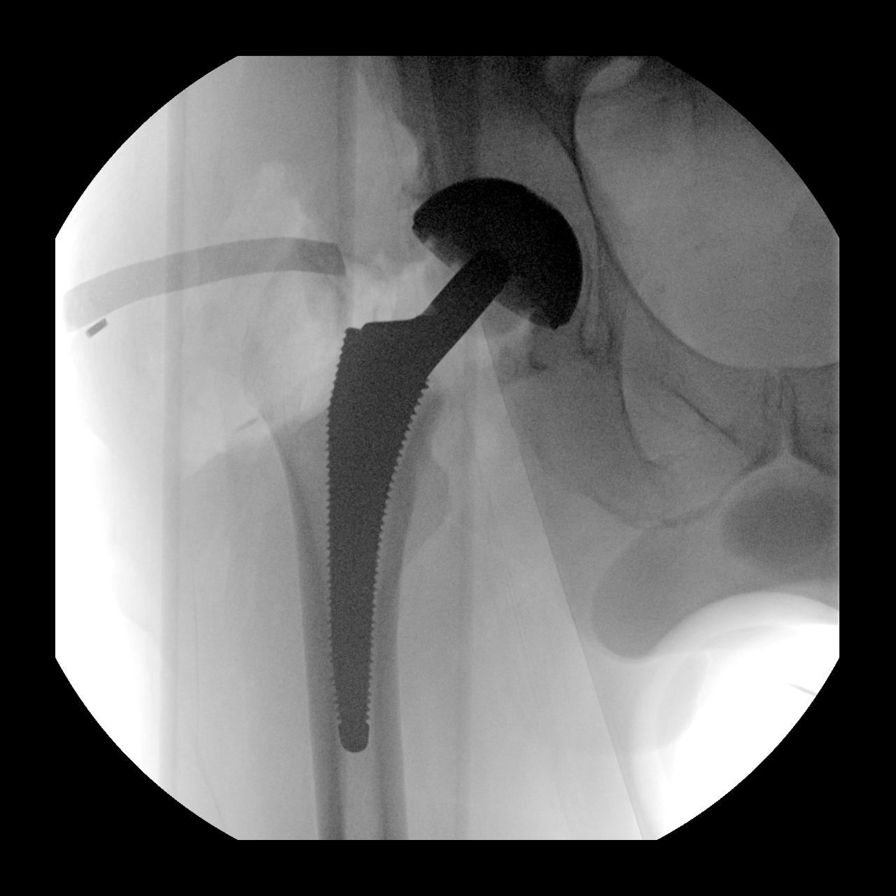
[im 3/5]
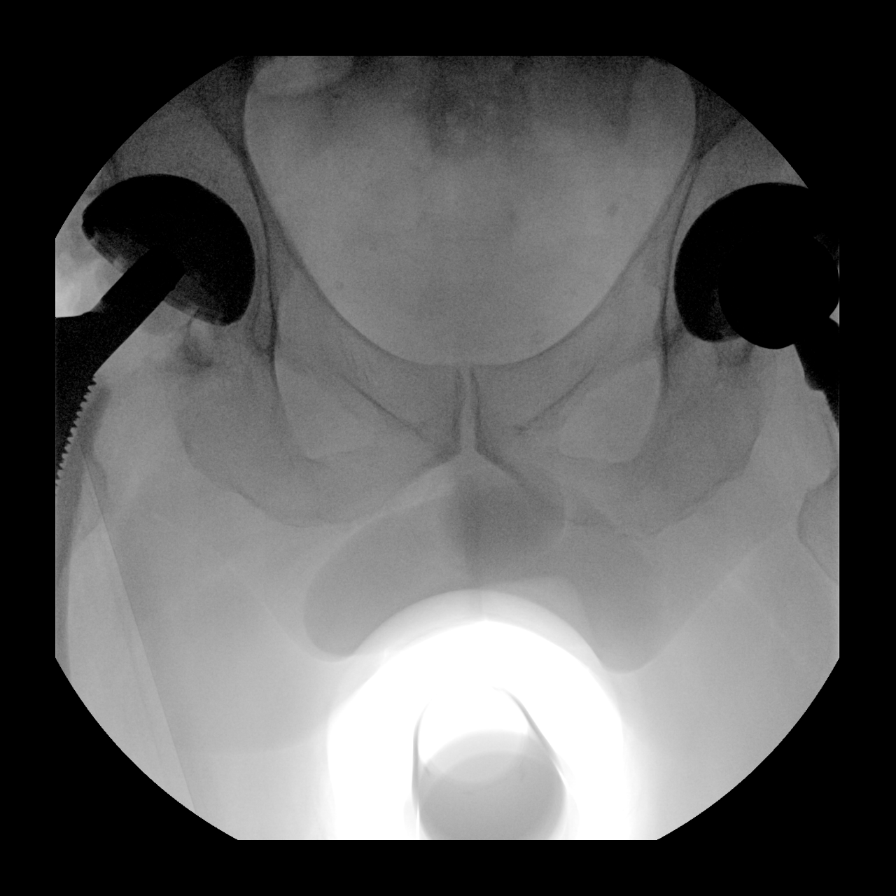
[im 4/5]
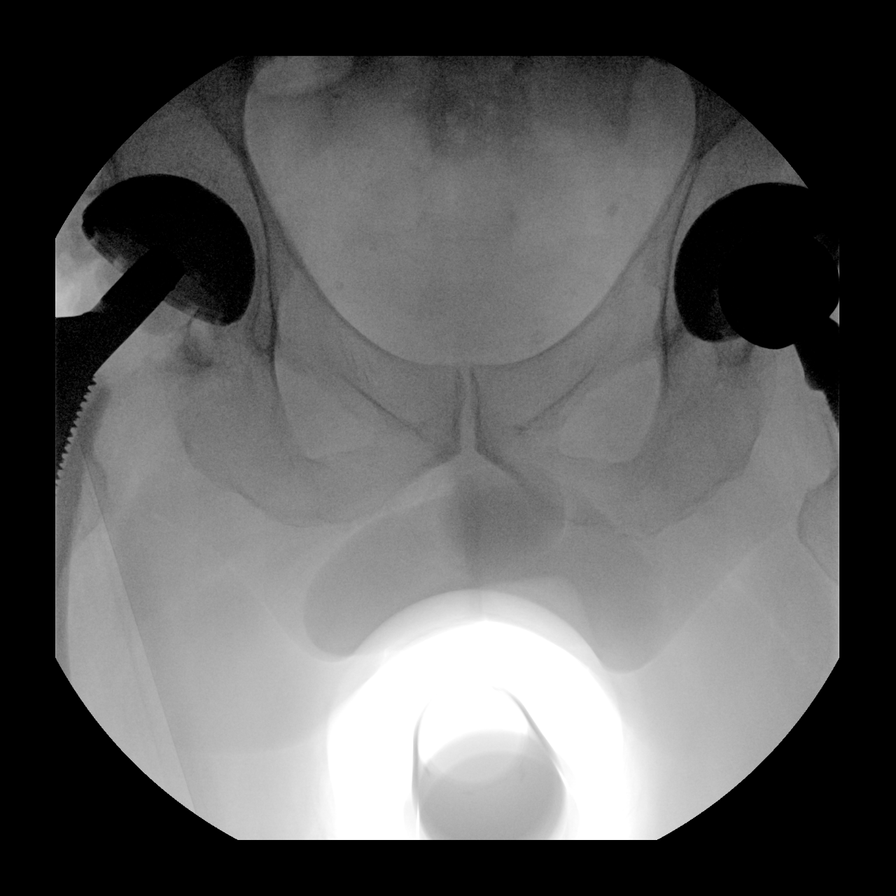
[im 5/5]
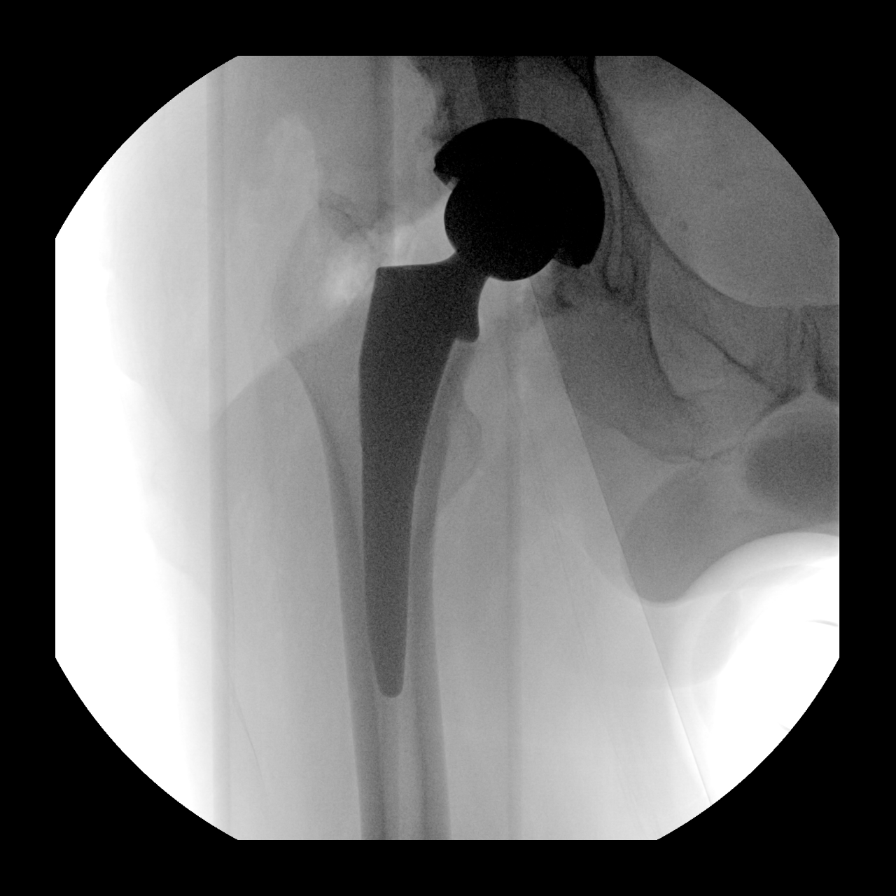

[5 of 5 positions shown; findings below may reference images not displayed]

FINDINGS: A single AP radiograph of the bilateral hips and pelvis is
submitted. Additionally, four intra-procedural fluoroscopic images
of the hips are submitted. The images demonstrate interval right hip
arthroplasty. The femoral and acetabular components appear well
seated. No adverse features. Redemonstrated left hip arthroplasty.
IMPRESSION: Interval right hip arthroplasty.  No adverse features.
